# Patient Record
Sex: Female | Born: 1965 | Race: Asian | Hispanic: No | Marital: Married | Smoking: Never smoker
Health system: Southern US, Community
[De-identification: ages and names within clinical notes are randomized; demographics above are authoritative.]

## PROBLEM LIST (undated history)

## (undated) ENCOUNTER — Inpatient Hospital Stay: Admission: EM | Payer: Self-pay | Source: Home / Self Care

## (undated) DIAGNOSIS — N939 Abnormal uterine and vaginal bleeding, unspecified: Secondary | ICD-10-CM

## (undated) DIAGNOSIS — Z973 Presence of spectacles and contact lenses: Secondary | ICD-10-CM

## (undated) DIAGNOSIS — I10 Essential (primary) hypertension: Secondary | ICD-10-CM

## (undated) DIAGNOSIS — F419 Anxiety disorder, unspecified: Secondary | ICD-10-CM

## (undated) HISTORY — DX: Essential (primary) hypertension: I10

## (undated) HISTORY — DX: Abnormal uterine and vaginal bleeding, unspecified: N93.9

## (undated) HISTORY — PX: BREAST ENHANCEMENT SURGERY: SHX7

## (undated) HISTORY — PX: NASAL SINUS SURGERY: SHX719

## (undated) HISTORY — DX: Anxiety disorder, unspecified: F41.9

---

## 2011-11-15 ENCOUNTER — Ambulatory Visit (INDEPENDENT_AMBULATORY_CARE_PROVIDER_SITE_OTHER): Payer: BC Managed Care – PPO | Admitting: Internal Medicine

## 2011-11-15 VITALS — BP 128/84 | HR 77 | Temp 97.7°F | Ht 62.0 in | Wt 104.0 lb

## 2011-11-15 DIAGNOSIS — E010 Iodine-deficiency related diffuse (endemic) goiter: Secondary | ICD-10-CM | POA: Insufficient documentation

## 2011-11-15 DIAGNOSIS — R209 Unspecified disturbances of skin sensation: Secondary | ICD-10-CM

## 2011-11-15 DIAGNOSIS — M779 Enthesopathy, unspecified: Secondary | ICD-10-CM

## 2011-11-15 DIAGNOSIS — R202 Paresthesia of skin: Secondary | ICD-10-CM

## 2011-11-15 DIAGNOSIS — E049 Nontoxic goiter, unspecified: Secondary | ICD-10-CM

## 2011-11-15 LAB — CBC WITH DIFFERENTIAL/PLATELET
Basophils Absolute: 0 10*3/uL (ref 0.0–0.1)
Eosinophils Relative: 0.6 % (ref 0.0–5.0)
Monocytes Absolute: 0.4 10*3/uL (ref 0.1–1.0)
Monocytes Relative: 5.7 % (ref 3.0–12.0)
Neutrophils Relative %: 63.2 % (ref 43.0–77.0)
Platelets: 207 10*3/uL (ref 150.0–400.0)
WBC: 7.4 10*3/uL (ref 4.5–10.5)

## 2011-11-15 LAB — BASIC METABOLIC PANEL
BUN: 9 mg/dL (ref 6–23)
Creatinine, Ser: 0.6 mg/dL (ref 0.4–1.2)
GFR: 109.12 mL/min (ref >60.00)

## 2011-11-15 LAB — TSH: TSH: 1.54 u[IU]/mL (ref 0.35–5.50)

## 2011-11-15 LAB — VITAMIN B12: Vitamin B-12: 953 pg/mL — ABNORMAL HIGH (ref 211–911)

## 2011-11-15 MED ORDER — PREDNISONE 10 MG PO TABS: ORAL_TABLET | ORAL | Status: DC

## 2011-11-15 NOTE — Patient Instructions (Signed)
Will schedule a ultrasound of your thyroid Came back in 3 weeks

## 2011-11-15 NOTE — Assessment & Plan Note (Signed)
Tip of the finger paresthesia, pinprick examination normal. Labs.

## 2011-11-15 NOTE — Assessment & Plan Note (Addendum)
Incidental finding, we'll check labs and ultrasound Son--->  Hieu [phone]

## 2011-11-15 NOTE — Progress Notes (Signed)
  Subjective:    Patient ID: Latoya Lawson, female    DOB: 01-Mar-1966, 46 y.o.   MRN: [id number]  HPI New patient, here with a translator. Chief complaint today is pain at the right middle finger for few weeks. The pain is associated with swelling on most noticeable at the base of the finger and the PIP. Denies any injury although she works at a [employer] and she uses her fingers a lot. Also some numbness in all fingers.  Past medical history No active med problems   Past surgical history T&A  Social history From [place] Married, children x 2  Tobacco--no ETOH-- rarely   Family history Arthritis, mother High cholesterol, mother and father Hypertension father mother Diabetes--father Colon cancer--no Breast cancer--no   Review of Systems No fever chills, no headaches or weight loss. No skin rashes. No lower extremity edema. Denies actual pain at the shoulder, elbow, and the wrists. other fingers are not involved either. No neck pain    Objective:   Physical Exam  Constitutional: She is oriented to person, place, and time. She appears well-developed and well-nourished.  HENT:  Head: Normocephalic and atraumatic.  Neck:       Symmetrically enlarged thyroid gland, smooth, no tender   Cardiovascular: Normal rate, regular rhythm and normal heart sounds.   No murmur heard. Pulmonary/Chest: Effort normal and breath sounds normal. No respiratory distress. She has no wheezes. She has no rales.  Musculoskeletal:       Left hand and wrist normal. Right hand and wrist normal except for a very subtle swelling at the  middle finger PIP along with some tenderness. She is also tender at the base of the middle finger, palmar aspect. A trigger phenomenon noted in the same finger. No plaques to suggest Dupuytren's.  Neurological: She is alert and oriented to person, place, and time.       DTRs normal Pinprick examination of UE normal       Assessment & Plan:  Today , I spent more than 30  min with the patient, >50% of the time counseling in detail about the plan of care through a translator

## 2011-11-15 NOTE — Assessment & Plan Note (Signed)
Symptoms consistent with finger tendinitis. We'll treat with a short course of steroids and reassess in 3 weeks. Will also do x-ray of the hand

## 2011-11-16 ENCOUNTER — Encounter: Payer: Self-pay | Admitting: Internal Medicine

## 2011-11-20 ENCOUNTER — Encounter: Payer: Self-pay | Admitting: *Deleted

## 2011-11-21 NOTE — Progress Notes (Signed)
thanks

## 2011-11-22 ENCOUNTER — Ambulatory Visit
Admission: RE | Admit: 2011-11-22 | Discharge: 2011-11-22 | Disposition: A | Discharge status: Home / Self Care | Payer: BC Managed Care – PPO | Source: Ambulatory Visit | Attending: Internal Medicine | Admitting: Internal Medicine

## 2011-11-22 DIAGNOSIS — E010 Iodine-deficiency related diffuse (endemic) goiter: Secondary | ICD-10-CM

## 2011-11-28 ENCOUNTER — Telehealth: Payer: Self-pay | Admitting: *Deleted

## 2011-11-28 NOTE — Telephone Encounter (Signed)
Please advise 

## 2011-11-28 NOTE — Telephone Encounter (Signed)
I personally discuss results w/ pt's son , thyroid is enlarged and has a nodule, need to repeat in 6 months. Please discuss that w/  Pt's son, OV if further advise needed

## 2011-11-28 NOTE — Telephone Encounter (Signed)
LMOVM to return call.

## 2011-11-28 NOTE — Telephone Encounter (Signed)
[facility] Triage Call Report Triage Record Num: [id number] Operator: Tesha Archambeau Patient Name: [facility] Go Call Date & Time: 11/28/2011 9:46:22AM Patient Phone: [phone] PCP: Nolon Rod. Paz Patient Gender: Female PCP Fax : Patient DOB: [birth year] Practice Name: [facility] Reason for Call: Caller: Hieu/Other; PCP: Willow Ora; CB#: [phone]; Son calling, she got a call today that her thyroid gland was enlarged and she needs some testing. He is trying to find out additional information. PLEASE CALL and ask to speak with Lottie Mussel so he can help her understand . Protocol(s) Used: Office Note Recommended Outcome per Protocol: Information Noted and Sent to Office Reason for Outcome: Caller information to office Care Advice: ~ 02/

## 2011-12-04 ENCOUNTER — Ambulatory Visit (INDEPENDENT_AMBULATORY_CARE_PROVIDER_SITE_OTHER): Payer: BC Managed Care – PPO | Admitting: Internal Medicine

## 2011-12-04 VITALS — BP 136/84 | HR 100 | Temp 98.1°F | Wt 103.0 lb

## 2011-12-04 DIAGNOSIS — E010 Iodine-deficiency related diffuse (endemic) goiter: Secondary | ICD-10-CM

## 2011-12-04 DIAGNOSIS — E049 Nontoxic goiter, unspecified: Secondary | ICD-10-CM

## 2011-12-04 DIAGNOSIS — M779 Enthesopathy, unspecified: Secondary | ICD-10-CM

## 2011-12-04 MED ORDER — AZELASTINE HCL 0.1 % NA SOLN: 2.0000 | Freq: Every day | NASAL | Status: DC

## 2011-12-04 NOTE — Patient Instructions (Signed)
Will refer you to a specialist for the hand pain, ask your son to call the office next week if you haven't heard from [place] --------------------------------------------------------------------- We will call you in 6 months to schedule your thyroid ultrasound --------------------------------------------------------------------- Fore the nose congestion, use astelin a nasal spray, 2 puffs on each side of the nose every night until better Call if you get worse

## 2011-12-04 NOTE — Assessment & Plan Note (Addendum)
Ultrasound show a thyroid nodule, I explained to the best of my ability that this is likely a benign finding but we need to monitor this situation with the ultrasound in 6 months. If the thyroid nodule change or increase in size, she may need a biopsy;  not we'll continue monitoring. She likes to get it "treated", I explained her that the one way to treat this is by doing surgery but at this time I don't think is necessary.

## 2011-12-04 NOTE — Progress Notes (Signed)
  Subjective:    Patient ID: Latoya Lawson, female    DOB: [birth year], 46 y.o.   MRN: [id number]  HPI Here with a translator for a followup from the last office visit. Her main concern is to understand the results of the thyroid ultrasound. She took prednisone for tendinitis in the right hand, has improved 60% but is still bothered by her symptoms. Additionally, she developed nose congestion and postnasal dripping 3 days ago.  Past medical history  2-12 thyroid u/s---> nodule, repeat 05-2011 Past surgical history  T&A  Social history  From [place]  Married, children x 2  Tobacco--no  ETOH-- rarely    Review of Systems No fever or chills No cough or chest congestion    Objective:   Physical Exam Alert, oriented x3, no apparent distress. HEENT: Tympanic membranes normal, throat normal, no congested, face symmetric and nontender to palpation Lungs: Clear to auscultation bilaterally MS: Left hand and wrist normal. Right hand and wrist normal no swelling , + trigger phenomenon noted in the middle finger. No plaques to suggest Dupuytren's.        Assessment & Plan:  Today , I spent more than 15  min with the patient, >50% of the time counseling about the thyroid nodule

## 2011-12-04 NOTE — Assessment & Plan Note (Addendum)
Has improved "60%" she continued with some pain and she still has a trigger  phenomenon. Actually the hand x-ray was not done. Plan: ortho referral, may benefit from a local injection rather than continue taking anti-inflammatories

## 2011-12-05 ENCOUNTER — Encounter: Payer: Self-pay | Admitting: Internal Medicine

## 2011-12-12 ENCOUNTER — Ambulatory Visit: Payer: BC Managed Care – PPO | Admitting: Internal Medicine

## 2012-05-29 ENCOUNTER — Telehealth: Payer: Self-pay | Admitting: Internal Medicine

## 2012-05-29 DIAGNOSIS — E010 Iodine-deficiency related diffuse (endemic) goiter: Secondary | ICD-10-CM

## 2012-05-29 NOTE — Telephone Encounter (Signed)
please arrange a thyroid ultrasound--- dx thyromegaly

## 2012-05-31 NOTE — Telephone Encounter (Signed)
Order entered

## 2012-06-10 ENCOUNTER — Ambulatory Visit
Admission: RE | Admit: 2012-06-10 | Discharge: 2012-06-10 | Disposition: A | Discharge status: Home / Self Care | Payer: BC Managed Care – PPO | Source: Ambulatory Visit | Attending: Internal Medicine | Admitting: Internal Medicine

## 2012-06-10 DIAGNOSIS — E010 Iodine-deficiency related diffuse (endemic) goiter: Secondary | ICD-10-CM

## 2012-06-14 ENCOUNTER — Encounter: Payer: Self-pay | Admitting: *Deleted

## 2013-05-07 ENCOUNTER — Ambulatory Visit: Payer: BC Managed Care – PPO | Admitting: Internal Medicine

## 2013-06-16 ENCOUNTER — Telehealth: Payer: Self-pay | Admitting: Internal Medicine

## 2013-06-16 ENCOUNTER — Encounter: Payer: Self-pay | Admitting: *Deleted

## 2013-06-16 NOTE — Telephone Encounter (Signed)
Call pt, she is due for a CPX, will also  arrange for a thyroid u/s at time of visit. Will need a interpreter. If unable to communicate via phone, send a letter

## 2013-06-16 NOTE — Telephone Encounter (Signed)
Letter sent to patient to call and schedule appt for CPE. Unable to communicate with pt via phone, interpreter not available.

## 2013-10-10 ENCOUNTER — Encounter: Payer: Self-pay | Admitting: Family Medicine

## 2013-10-10 ENCOUNTER — Ambulatory Visit (INDEPENDENT_AMBULATORY_CARE_PROVIDER_SITE_OTHER): Payer: BC Managed Care – PPO | Admitting: Family Medicine

## 2013-10-10 VITALS — BP 152/88 | HR 69 | Temp 97.9°F | Wt 98.4 lb

## 2013-10-10 DIAGNOSIS — IMO0001 Reserved for inherently not codable concepts without codable children: Secondary | ICD-10-CM

## 2013-10-10 DIAGNOSIS — J329 Chronic sinusitis, unspecified: Secondary | ICD-10-CM

## 2013-10-10 DIAGNOSIS — R030 Elevated blood-pressure reading, without diagnosis of hypertension: Secondary | ICD-10-CM

## 2013-10-10 DIAGNOSIS — I10 Essential (primary) hypertension: Secondary | ICD-10-CM

## 2013-10-10 DIAGNOSIS — R51 Headache: Secondary | ICD-10-CM

## 2013-10-10 LAB — HEPATIC FUNCTION PANEL
ALBUMIN: 4.5 g/dL (ref 3.5–5.2)
ALK PHOS: 43 U/L (ref 39–117)
ALT: 16 U/L (ref 0–35)
AST: 16 U/L (ref 0–37)
BILIRUBIN DIRECT: 0 mg/dL (ref 0.0–0.3)
TOTAL PROTEIN: 7.6 g/dL (ref 6.0–8.3)
Total Bilirubin: 0.8 mg/dL (ref 0.3–1.2)

## 2013-10-10 LAB — CBC WITH DIFFERENTIAL/PLATELET
BASOS PCT: 0.3 % (ref 0.0–3.0)
Basophils Absolute: 0 10*3/uL (ref 0.0–0.1)
EOS ABS: 0 10*3/uL (ref 0.0–0.7)
EOS PCT: 0.5 % (ref 0.0–5.0)
HCT: 41.5 % (ref 36.0–46.0)
HEMOGLOBIN: 14.1 g/dL (ref 12.0–15.0)
Lymphocytes Relative: 26.7 % (ref 12.0–46.0)
Lymphs Abs: 1.9 10*3/uL (ref 0.7–4.0)
MCHC: 33.9 g/dL (ref 30.0–36.0)
MCV: 89.7 fl (ref 78.0–100.0)
MONO ABS: 0.5 10*3/uL (ref 0.1–1.0)
Monocytes Relative: 6.3 % (ref 3.0–12.0)
NEUTROS ABS: 4.8 10*3/uL (ref 1.4–7.7)
Neutrophils Relative %: 66.2 % (ref 43.0–77.0)
Platelets: 210 10*3/uL (ref 150.0–400.0)
RBC: 4.62 Mil/uL (ref 3.87–5.11)
RDW: 12.2 % (ref 11.5–14.6)
WBC: 7.3 10*3/uL (ref 4.5–10.5)

## 2013-10-10 LAB — LIPID PANEL
CHOL/HDL RATIO: 3
Cholesterol: 179 mg/dL (ref 0–200)
HDL: 64 mg/dL (ref >39.00)
LDL Cholesterol: 93 mg/dL (ref 0–99)
TRIGLYCERIDES: 111 mg/dL (ref 0.0–149.0)
VLDL: 22.2 mg/dL (ref 0.0–40.0)

## 2013-10-10 LAB — BASIC METABOLIC PANEL
BUN: 9 mg/dL (ref 6–23)
CHLORIDE: 104 meq/L (ref 96–112)
CO2: 29 meq/L (ref 19–32)
Calcium: 9.2 mg/dL (ref 8.4–10.5)
Creatinine, Ser: 0.6 mg/dL (ref 0.4–1.2)
GFR: 110.2 mL/min (ref >60.00)
Glucose, Bld: 93 mg/dL (ref 70–99)
POTASSIUM: 3.5 meq/L (ref 3.5–5.1)
SODIUM: 138 meq/L (ref 135–145)

## 2013-10-10 LAB — TSH: TSH: 0.44 u[IU]/mL (ref 0.35–5.50)

## 2013-10-10 MED ORDER — LISINOPRIL 10 MG PO TABS: 10.0000 mg | ORAL_TABLET | Freq: Every day | ORAL | Status: DC

## 2013-10-10 NOTE — Progress Notes (Signed)
Pre visit review using our clinic review tool, if applicable. No additional management support is needed unless otherwise documented below in the visit note. 

## 2013-10-10 NOTE — Progress Notes (Signed)
  Subjective:    Latoya Lawson is a 48 y.o. female who presents for evaluation of elevated blood pressures. Translator via phone.   Age at onset of elevated blood pressure:  47. Cardiac symptoms: none. Patient denies: chest pain, chest pressure/discomfort, claudication, dyspnea, exertional chest pressure/discomfort, fatigue, irregular heart beat, lower extremity edema, near-syncope, orthopnea, palpitations, paroxysmal nocturnal dyspnea, syncope and tachypnea. Cardiovascular risk factors: hypertension. Use of agents associated with hypertension: none. History of target organ damage: none.  Pt states bp has been running high last few months.  She is also requesting labs be done. Pt c/o headaches recently.  No congestion---pt with hx of sinus surgery and thinks some of her symptoms are because of her sinuses.  The following portions of the patient's history were reviewed and updated as appropriate: allergies, current medications, past family history, past medical history, past social history, past surgical history and problem list.  Review of Systems Pertinent items are noted in HPI.   Objective:    BP 152/88  Pulse 69  Temp(Src) 97.9 F (36.6 C) (Oral)  Wt 98 lb 6.4 oz (44.634 kg)  SpO2 98% General appearance: alert, cooperative, appears stated age and no distress Eyes: conjunctivae/corneas clear. PERRL, EOM's intact. Fundi benign. Ears: normal TM's and external ear canals both ears Nose: no discharge, no congestion, turbinates red, swollen Throat: lips, mucosa, and tongue normal; teeth and gums normal Neck: no adenopathy, supple, symmetrical, trachea midline and thyroid not enlarged, symmetric, no tenderness/mass/nodules Lungs: clear to auscultation bilaterally Heart: S1, S2 normal Extremities: extremities normal, atraumatic, no cyanosis or edema  Cardiographics ECG: normal sinus rhythm    Assessment:    Hypertension, stage 1 . Evidence of target organ damage: none.    Plan:    Medication: begin lisinopril. Screening labs for initial evaluation: basic metabolic panel, lipid panel and urinalysis. Dietary sodium restriction. Regular aerobic exercise. Check blood pressures 2-3 times weekly and record. Follow up: 2 weeks and as needed. --with pcp

## 2013-10-10 NOTE — Assessment & Plan Note (Signed)
Ct sinuses May need ent f/u

## 2013-10-10 NOTE — Patient Instructions (Signed)

## 2013-10-13 LAB — POCT URINALYSIS DIPSTICK
BILIRUBIN UA: NEGATIVE
Blood, UA: NEGATIVE
GLUCOSE UA: NEGATIVE
KETONES UA: NEGATIVE
LEUKOCYTES UA: NEGATIVE
NITRITE UA: NEGATIVE
PH UA: 6.5
Protein, UA: NEGATIVE
Spec Grav, UA: 1.01
Urobilinogen, UA: 0.2

## 2013-10-27 ENCOUNTER — Ambulatory Visit: Payer: BC Managed Care – PPO | Admitting: Internal Medicine

## 2013-10-27 DIAGNOSIS — Z0289 Encounter for other administrative examinations: Secondary | ICD-10-CM

## 2013-11-10 ENCOUNTER — Emergency Department (HOSPITAL_BASED_OUTPATIENT_CLINIC_OR_DEPARTMENT_OTHER)
Admission: EM | Admit: 2013-11-10 | Discharge: 2013-11-10 | Disposition: A | Discharge status: Other Acute Inpatient Hospital | Payer: BC Managed Care – PPO | Attending: Emergency Medicine | Admitting: Emergency Medicine

## 2013-11-10 ENCOUNTER — Ambulatory Visit (HOSPITAL_BASED_OUTPATIENT_CLINIC_OR_DEPARTMENT_OTHER)
Admission: RE | Admit: 2013-11-10 | Discharge: 2013-11-10 | Disposition: A | Discharge status: Home / Self Care | Payer: BC Managed Care – PPO | Source: Ambulatory Visit | Attending: Family Medicine | Admitting: Family Medicine

## 2013-11-10 DIAGNOSIS — R51 Headache: Secondary | ICD-10-CM

## 2013-11-10 DIAGNOSIS — J329 Chronic sinusitis, unspecified: Secondary | ICD-10-CM

## 2013-11-10 DIAGNOSIS — J3489 Other specified disorders of nose and nasal sinuses: Secondary | ICD-10-CM | POA: Insufficient documentation

## 2013-12-04 ENCOUNTER — Encounter: Payer: Self-pay | Admitting: Certified Nurse Midwife

## 2013-12-08 ENCOUNTER — Ambulatory Visit: Payer: Self-pay | Admitting: Certified Nurse Midwife

## 2013-12-15 ENCOUNTER — Encounter: Payer: Self-pay | Admitting: Certified Nurse Midwife

## 2013-12-15 ENCOUNTER — Other Ambulatory Visit: Payer: Self-pay | Admitting: Certified Nurse Midwife

## 2013-12-15 ENCOUNTER — Ambulatory Visit (INDEPENDENT_AMBULATORY_CARE_PROVIDER_SITE_OTHER): Payer: BC Managed Care – PPO | Admitting: Certified Nurse Midwife

## 2013-12-15 VITALS — BP 113/77 | HR 77 | Resp 16 | Ht 61.5 in | Wt 94.0 lb

## 2013-12-15 DIAGNOSIS — Z0000 Encounter for general adult medical examination without abnormal findings: Secondary | ICD-10-CM

## 2013-12-15 DIAGNOSIS — N63 Unspecified lump in unspecified breast: Secondary | ICD-10-CM

## 2013-12-15 DIAGNOSIS — Z01419 Encounter for gynecological examination (general) (routine) without abnormal findings: Secondary | ICD-10-CM

## 2013-12-15 DIAGNOSIS — E049 Nontoxic goiter, unspecified: Secondary | ICD-10-CM

## 2013-12-15 DIAGNOSIS — N6320 Unspecified lump in the left breast, unspecified quadrant: Secondary | ICD-10-CM

## 2013-12-15 LAB — CBC WITH DIFFERENTIAL/PLATELET
BASOS ABS: 0 10*3/uL (ref 0.0–0.1)
Basophils Relative: 0 % (ref 0–1)
EOS ABS: 0.1 10*3/uL (ref 0.0–0.7)
Eosinophils Relative: 1 % (ref 0–5)
HCT: 40.1 % (ref 36.0–46.0)
Hemoglobin: 13.9 g/dL (ref 12.0–15.0)
Lymphocytes Relative: 34 % (ref 12–46)
Lymphs Abs: 2.6 10*3/uL (ref 0.7–4.0)
MCH: 29.5 pg (ref 26.0–34.0)
MCHC: 34.7 g/dL (ref 30.0–36.0)
MCV: 85.1 fL (ref 78.0–100.0)
Monocytes Absolute: 0.8 10*3/uL (ref 0.1–1.0)
Monocytes Relative: 10 % (ref 3–12)
NEUTROS ABS: 4.2 10*3/uL (ref 1.7–7.7)
Neutrophils Relative %: 55 % (ref 43–77)
Platelets: 225 10*3/uL (ref 150–400)
RBC: 4.71 MIL/uL (ref 3.87–5.11)
RDW: 12.4 % (ref 11.5–15.5)
WBC: 7.7 10*3/uL (ref 4.0–10.5)

## 2013-12-15 LAB — THYROID PANEL WITH TSH
FREE THYROXINE INDEX: 4.3 — AB (ref 1.0–3.9)
T3 Uptake: 42.2 % — ABNORMAL HIGH (ref 22.5–37.0)
T4 TOTAL: 10.3 ug/dL (ref 5.0–12.5)
TSH: 0.008 u[IU]/mL — AB (ref 0.350–4.500)

## 2013-12-15 NOTE — Progress Notes (Signed)
Patient scheduled for bilateral dx mammogram with L Breast US for 12/16/13 at 1:15 at [facility]. Patient aware of appointment via translator and given written copy of appointment location.

## 2013-12-15 NOTE — Patient Instructions (Addendum)

## 2013-12-15 NOTE — Progress Notes (Signed)
48 y.o. G2P2 Married Asian Fe here for annual exam. Patient has limited [place] and has interpreter with her. Periods normal, no issues. Contraception condoms consistently. Patient has lost 2 more pounds, current weight 94. Patient feels sad because son is not here in [place]. "She grieves for him". She does talk with him on the phone often which helps. Diet consists of 3 small bowls of rice and vegetables and meat daily. No soda use only coffee and water. Patient works hard daily, but "feels OK, sleeps well". Denies coughing, fevers, or chills. Takes multivitamin daily. Sees PCP for lab work and no abnormal findings last visit. No pain or other problems today. Does not want pills for sadness.  Patient's last menstrual period was 11/28/2013.          Sexually active: yes  The current method of family planning is condoms all the time.    Exercising: no  exercise Smoker:  no  Health Maintenance: Pap:  10-12-11 neg/HPV HR neg MMG: none Colonoscopy:  none BMD:   none TDaP:  2010 Labs: pcp Self breast exam: pt checks   reports that she has never smoked. She has never used smokeless tobacco. She reports that she does not drink alcohol or use illicit drugs.  Past Medical History  Diagnosis Date  . Hypertension   . Anxiety   . Abnormal uterine bleeding     after depo    Past Surgical History  Procedure Laterality Date  . Breast enhancement surgery  2088  . Nasal sinus surgery      Current Outpatient Prescriptions  Medication Sig Dispense Refill  . lisinopril (PRINIVIL,ZESTRIL) 10 MG tablet Take 1 tablet (10 mg total) by mouth daily.  30 tablet  2  . Multiple Vitamin (MULTIVITAMIN) tablet Take 1 tablet by mouth daily.      . Omega-3 Fatty Acids (FISH OIL) 1000 MG CAPS Take 1 capsule by mouth.      Marland Kitchen UNKNOWN TO PATIENT Pill to take for infection      . azelastine (ASTELIN) 137 MCG/SPRAY nasal spray Place 2 sprays into the nose daily. Use in each nostril as directed  30 mL  3   No current  facility-administered medications for this visit.    Family History  Problem Relation Age of Onset  . Hypertension Mother   . Hypertension Father   . Diabetes Father   . Stroke Father   . Hypertension Sister   . Hypertension Brother   . Hypertension Maternal Grandmother   . Hypertension Maternal Grandfather   . Hypertension Paternal Grandmother   . Hypertension Paternal Grandfather     ROS:  Pertinent items are noted in HPI.  Otherwise, a comprehensive ROS was negative.  Exam:   BP 113/77  Pulse 77  Resp 16  Ht 5' 1.5" (1.562 m)  Wt 94 lb (42.638 kg)  BMI 17.48 kg/m2  LMP 11/28/2013 Height: 5' 1.5" (156.2 cm)  Ht Readings from Last 3 Encounters:  12/15/13 5' 1.5" (1.562 m)  11/15/11 5\' 2"  (1.575 m)    General appearance: alert, cooperative and appears stated age Head: Normocephalic, without obvious abnormality, atraumatic Neck: no adenopathy, supple, symmetrical, trachea midline and thyroid, mildly enlarged , no nodules palpated. Lungs: clear to auscultation bilaterally Breasts: normal appearance, no masses or tenderness, No nipple retraction or dimpling, No nipple discharge or bleeding, No axillary or supraclavicular adenopathy, right breast with saline implants. Left breast at 2 o'clock at nipple area in areola 1 cm mass noted, mobile, non  tender, no nipple discharge or skin changes noted, implant appears intact. Heart: regular rate and rhythm Abdomen: soft, non-tender; no masses,  no organomegaly Extremities: extremities normal, atraumatic, no cyanosis or edema Skin: Skin color, texture, turgor normal. No rashes or lesions Lymph nodes: Cervical, supraclavicular, and axillary nodes normal. No abnormal inguinal nodes palpated Neurologic: Grossly normal   Pelvic: External genitalia:  no lesions              Urethra:  normal appearing urethra with no masses, tenderness or lesions              Bartholin's and Skene's: normal                 Vagina: normal appearing  vagina with normal color and discharge, no lesions              Cervix: normal, non tender              Pap taken: yes Bimanual Exam:  Uterus:  normal size, contour, position, consistency, mobility, non-tender and anteverted              Adnexa: normal adnexa and no mass, fullness, tenderness               Rectovaginal: Confirms               Anus:  normal sphincter tone, no lesions  A:  Well Woman with normal exam  Contraception condoms  Weight loss  Left breast mass  ? Depression  Mildly enlarged thyroid, ? Due to body habitus  P:   Reviewed health and wellness pertinent to exam  Discussed weight loss effects on health and well being. Encouraged to start on Ensure protein two daily to help with decreasing muscle loss. Patient has used before and it helped. Encouraged snacks between meals if possible to help control weight loss. Patient will try.  Labs: TSH,CBC with Diff.  Discussed findings and need for evaluation. Patient agreeable. Diagnostic mammogram order with [place] if needed. This appointment was confirmed with patient/intrpreter.  Discussed does she feel depressed?, she does not. No thought of self harm or others. Encouraged to discussed feelings with spouse and plan visit with son.  Pap smear as per guidelines   Mammogram yearly pap smear taken today with reflex  counseled on breast self exam, adequate intake of calcium and vitamin D, diet and exercise  return annually or prn  An After Visit Summary was printed and given to the patient.

## 2013-12-16 ENCOUNTER — Ambulatory Visit: Payer: Self-pay | Admitting: Certified Nurse Midwife

## 2013-12-16 LAB — VITAMIN D 25 HYDROXY (VIT D DEFICIENCY, FRACTURES): VIT D 25 HYDROXY: 30 ng/mL (ref 30–89)

## 2013-12-16 LAB — IPS PAP TEST WITH HPV

## 2013-12-18 LAB — THYROID PEROXIDASE ANTIBODY

## 2013-12-19 NOTE — Progress Notes (Signed)
Consider nutritionist referral.    Reviewed personally.  Felipa Emory, MD.

## 2013-12-24 ENCOUNTER — Other Ambulatory Visit: Payer: Self-pay | Admitting: Certified Nurse Midwife

## 2013-12-24 ENCOUNTER — Telehealth: Payer: Self-pay | Admitting: Certified Nurse Midwife

## 2013-12-24 DIAGNOSIS — E049 Nontoxic goiter, unspecified: Secondary | ICD-10-CM

## 2013-12-24 DIAGNOSIS — R6889 Other general symptoms and signs: Secondary | ICD-10-CM

## 2013-12-24 NOTE — Telephone Encounter (Signed)
Left message with female that answered the phone for the patient to contact our office.   *need to advise of appt w/Dr Carlis Abbott 04.01.2015 @ 1215

## 2013-12-25 ENCOUNTER — Telehealth: Payer: Self-pay | Admitting: Emergency Medicine

## 2013-12-25 NOTE — Telephone Encounter (Signed)
Spoke with Son of patient who confirmed identity. Okay to release information per release of information.   Message from Regina Eck CNM given.  He requested that labs be released on Mychart and they have been released.   Patient has appointment scheduled with Dr. Carlis Abbott for 12/31/13. He is aware of appointment.   Patient also has consult scheduled with [facility] as well for evaluation of breast.

## 2013-12-25 NOTE — Telephone Encounter (Signed)
Message copied by Michele Mcalpine on Thu Dec 25, 2013  4:28 PM ------      Message from: Regina Eck      Created: Wed Dec 24, 2013 11:45 AM       Thyroid antibodies normal, please notify ------

## 2013-12-25 NOTE — Telephone Encounter (Signed)
Notes Recorded by Regina Eck, CNM on 12/24/2013 at 11:45 AM Notify patient that Vitamin D is Borderline low and needs to start on Vitamin D 3 600 IU daily CBC is normal Thyroid profile is abnormal and patient has history of thyroid enlargement with no evaluation since 2013 she needs referral to endocrine for evaluation. Please make sure an appointment is scheduled. She does not understand English without interpreter Order in for internal referral Pap smear reviewed negative HPVHR negative 02

## 2013-12-25 NOTE — Telephone Encounter (Signed)
There is a release of information on file dated 03.16.2015 allowing [place] to speak with patients son Huie Vo. I spoke with her son. Advised of appointment with Dr Carlis Abbott 04.01.2015 @ 1215.

## 2013-12-29 ENCOUNTER — Ambulatory Visit (INDEPENDENT_AMBULATORY_CARE_PROVIDER_SITE_OTHER): Payer: Self-pay | Admitting: Surgery

## 2014-01-05 ENCOUNTER — Telehealth: Payer: Self-pay | Admitting: Certified Nurse Midwife

## 2014-01-05 DIAGNOSIS — E010 Iodine-deficiency related diffuse (endemic) goiter: Secondary | ICD-10-CM

## 2014-01-05 NOTE — Telephone Encounter (Signed)
Patient's son is calling concerning her referral to Dr. Jerline Pain. Patient son "Hiev" says there no interpreter is available with Dr. Jerline Pain. What does she need to do?

## 2014-01-05 NOTE — Telephone Encounter (Signed)
I am not sure what referral and which Dr. Jerline Pain is? Calling to clarify.   Message left to return call to [place] at [phone].

## 2014-01-06 NOTE — Telephone Encounter (Signed)
Spoke with patient's son. He states that patient does not have anyone to come to appointment with her. Advised we are trying to reach interpretor services to set up or get in contact with someone to get interpretor for patient when going to [place]. Advised we would get a way to ensure that patient has interpretor at next appointment and give son a call back. Son is agreeable and verbalizes understanding.

## 2014-01-06 NOTE — Telephone Encounter (Signed)
Called to Dr. Anell Barr office, office manager not in. Asked if I could call back in 1 hour to speak with Latoya Lawson.

## 2014-01-06 NOTE — Telephone Encounter (Signed)
Patient's son called back. He states patient went to see Dr. Jeanann Lewandowsky and there was no interpretor available and patient was alone and so they did not see patient that day.   Calling to Dr. Carlis Abbott office to clarify/reschedule patient. Office opens at 0900: [phone]

## 2014-01-06 NOTE — Telephone Encounter (Signed)
LMTCB for [phone] [facility] to assist.

## 2014-01-06 NOTE — Telephone Encounter (Signed)
Spoke with Dr.Clark's office. Was advised that upon making appointment for patient they instructed that she needed to bring someone with her to help interpret as they do not have this as a resource. She states "We were not even sure what language needed to be interpreted" and questioned "How do you handle this when she comes in." Advised politely that patient has interpreter with her when she comes in. Asked how we could best help this patient get a new appointment to be seen by [facility]. Was advised will need to inform patient's son an appointment can be made at any time that is convenient for someone who speaks English to come with the patient. Advised would call son to see what time works well for their schedule and call back to get patient another appointment. Dr. Carlis Abbott is not in on Friday's so no Friday appointments.   Called patient left message to call [place] back at [phone]

## 2014-01-06 NOTE — Telephone Encounter (Signed)
Spoke with Regina Eck CNM, okay to enter in new referral.   Referral to [facility] entered. Requested first available provider.   Spoke with Tona Sensing at [facility] at [phone] and she advised if any issues with obtaining interpretor to call her for additional help.

## 2014-01-06 NOTE — Telephone Encounter (Signed)
Latoya Lawson, can we make referral to a [facility] provider for endocrinology so that we can set up a [facility] interpretor for an appointment?

## 2014-01-06 NOTE — Telephone Encounter (Signed)
Agree with referral

## 2014-01-09 ENCOUNTER — Encounter: Payer: Self-pay | Admitting: *Deleted

## 2014-01-09 NOTE — Telephone Encounter (Signed)
Reviewed referral note, they are unable to reach patient to patient to schedule. [facility] and scheduled a new patient visit for patient. Mychart message sent as patient's son states he reads that as well. Message left to return call to [facility] at [phone].      01/20/14 at [facility] with Dr. Dwyane Dee.

## 2014-01-13 ENCOUNTER — Encounter (INDEPENDENT_AMBULATORY_CARE_PROVIDER_SITE_OTHER): Payer: Self-pay | Admitting: Surgery

## 2014-01-13 ENCOUNTER — Ambulatory Visit (INDEPENDENT_AMBULATORY_CARE_PROVIDER_SITE_OTHER): Payer: BC Managed Care – PPO | Admitting: Surgery

## 2014-01-13 VITALS — BP 119/72 | HR 70 | Temp 98.6°F | Resp 16 | Ht 62.5 in | Wt 95.4 lb

## 2014-01-13 DIAGNOSIS — N6320 Unspecified lump in the left breast, unspecified quadrant: Secondary | ICD-10-CM

## 2014-01-13 DIAGNOSIS — N63 Unspecified lump in unspecified breast: Secondary | ICD-10-CM

## 2014-01-13 NOTE — Telephone Encounter (Signed)
Patient has read my chart message. 

## 2014-01-13 NOTE — Progress Notes (Signed)
Patient ID: Latoya Lawson, female   DOB: 11-Jul-1966, 48 y.o.   MRN: [id number]  Chief Complaint  Patient presents with  . Breast Problem    HPI Latoya Lawson is a 48 y.o. female.   HPI This is a pleasant female referred by Dr. Marcelo Baldy for evaluation of a left breast mass. The patient does not speak English and is accompanied by an interpreter. I have had a difficult time determining whether she could feel a mass or not. She underwent mammogram and ultrasound demonstrating the 5 mm irregular lesion in the left breast. She has had bilateral breast augmentation. She had an adjustment of the left breast per her report. She denies nipple discharge. She has no previous history of breast masses. She is otherwise without complaints. Past Medical History  Diagnosis Date  . Hypertension   . Anxiety   . Abnormal uterine bleeding     after depo    Past Surgical History  Procedure Laterality Date  . Breast enhancement surgery  2088  . Nasal sinus surgery      Family History  Problem Relation Age of Onset  . Hypertension Mother   . Hypertension Father   . Diabetes Father   . Stroke Father   . Hypertension Sister   . Hypertension Brother   . Hypertension Maternal Grandmother   . Hypertension Maternal Grandfather   . Hypertension Paternal Grandmother   . Hypertension Paternal Grandfather     Social History History  Substance Use Topics  . Smoking status: Never Smoker   . Smokeless tobacco: Never Used  . Alcohol Use: No    No Known Allergies  Current Outpatient Prescriptions  Medication Sig Dispense Refill  . lisinopril (PRINIVIL,ZESTRIL) 10 MG tablet Take 1 tablet (10 mg total) by mouth daily.  30 tablet  2  . Multiple Vitamin (MULTIVITAMIN) tablet Take 1 tablet by mouth daily.      . Omega-3 Fatty Acids (FISH OIL) 1000 MG CAPS Take 1 capsule by mouth.      Marland Kitchen UNKNOWN TO PATIENT Pill to take for infection      . azelastine (ASTELIN) 137 MCG/SPRAY nasal spray Place 2 sprays into the nose  daily. Use in each nostril as directed  30 mL  3   No current facility-administered medications for this visit.    Review of Systems Review of Systems  Constitutional: Negative for fever, chills and unexpected weight change.  HENT: Negative for congestion, hearing loss, sore throat, trouble swallowing and voice change.   Eyes: Negative for visual disturbance.  Respiratory: Negative for cough and wheezing.   Cardiovascular: Negative for chest pain, palpitations and leg swelling.  Gastrointestinal: Negative for nausea, vomiting, abdominal pain, diarrhea, constipation, blood in stool, abdominal distention and anal bleeding.  Genitourinary: Negative for hematuria, vaginal bleeding and difficulty urinating.  Musculoskeletal: Negative for arthralgias.  Skin: Negative for rash and wound.  Neurological: Negative for seizures, syncope and headaches.  Hematological: Negative for adenopathy. Does not bruise/bleed easily.  Psychiatric/Behavioral: Negative for confusion.    Blood pressure 119/72, pulse 70, temperature 98.6 F (37 C), temperature source Oral, resp. rate 16, height 5' 2.5" (1.588 m), weight 95 lb 6.4 oz (43.273 kg), last menstrual period 11/28/2013.  Physical Exam Physical Exam  Constitutional: She is oriented to person, place, and time. She appears well-developed and well-nourished. No distress.  HENT:  Head: Normocephalic and atraumatic.  Right Ear: External ear normal.  Left Ear: External ear normal.  Nose: Nose normal.  Mouth/Throat: Oropharynx  is clear and moist. No oropharyngeal exudate.  Eyes: Conjunctivae are normal. Pupils are equal, round, and reactive to light. Right eye exhibits no discharge. Left eye exhibits no discharge. No scleral icterus.  Neck: Normal range of motion. Neck supple. No tracheal deviation present. No thyromegaly present.  Cardiovascular: Normal rate, regular rhythm, normal heart sounds and intact distal pulses.   No murmur heard. Pulmonary/Chest:  Effort normal and breath sounds normal. No respiratory distress. She has no wheezes. She has no rales.  Abdominal: Soft. Bowel sounds are normal. She exhibits no distension. There is no tenderness. There is no rebound.  Musculoskeletal: Normal range of motion. She exhibits no edema and no tenderness.  Lymphadenopathy:    She has no cervical adenopathy.    She has no axillary adenopathy.  Neurological: She is alert and oriented to person, place, and time.  Skin: Skin is warm and dry. No rash noted. She is not diaphoretic. No erythema.  Psychiatric: Her behavior is normal. Judgment normal.  Breasts: I cannot palpate a mass in either breast. Specifically, at the 1:00 position of left breast, I cannot palpate a 5 mm mass  Data Reviewed I have her ultrasound and mammograms demonstrating the 5 mm mass which is irregular at the 1:00 position of the left breast  Assessment    Left breast mass     Plan    I discussed this with the patient through the interpreter. Removal of the mass is recommended for histologic evaluation to rule out malignancy. As I cannot palpate a mass and she is not a candidate for either needle localization or a stereotactic biopsy given her limited breast tissue and the augmentation, I have discussed this with Dr. Marcelo Baldy.  He will see the patient preoperatively on the day of surgery and mark where the mass is on the overlying skin so I can proceed with excision through the mark. I discussed the risks of surgery which include but not limited to bleeding, infection, need for further surgery should malignancy be present, damage to the implant, et Ronney Asters. She understands and wishes to proceed. Surgery will be scheduled        Harl Bowie 01/13/2014, 3:38 PM

## 2014-01-20 ENCOUNTER — Ambulatory Visit (INDEPENDENT_AMBULATORY_CARE_PROVIDER_SITE_OTHER): Payer: BC Managed Care – PPO | Admitting: Endocrinology

## 2014-01-20 ENCOUNTER — Telehealth: Payer: Self-pay | Admitting: Family Medicine

## 2014-01-20 ENCOUNTER — Encounter: Payer: Self-pay | Admitting: Endocrinology

## 2014-01-20 VITALS — BP 122/82 | HR 84 | Temp 97.6°F | Resp 14 | Ht 62.0 in | Wt 98.0 lb

## 2014-01-20 DIAGNOSIS — E0590 Thyrotoxicosis, unspecified without thyrotoxic crisis or storm: Secondary | ICD-10-CM

## 2014-01-20 DIAGNOSIS — I10 Essential (primary) hypertension: Secondary | ICD-10-CM

## 2014-01-20 DIAGNOSIS — E049 Nontoxic goiter, unspecified: Secondary | ICD-10-CM

## 2014-01-20 LAB — T3, FREE: T3 FREE: 2.6 pg/mL (ref 2.3–4.2)

## 2014-01-20 LAB — T4, FREE: FREE T4: 0.71 ng/dL (ref 0.60–1.60)

## 2014-01-20 MED ORDER — LISINOPRIL 10 MG PO TABS: 10.0000 mg | ORAL_TABLET | Freq: Every day | ORAL | Status: DC

## 2014-01-20 NOTE — Telephone Encounter (Signed)
Patient's husband called requesting refill on lisinopril. They use [facility] on [address]

## 2014-01-20 NOTE — Progress Notes (Signed)
Quick Note:  Please let patient know that the thyroid result is back to normal and no further action needed, followup in 6 months ______

## 2014-01-20 NOTE — Patient Instructions (Signed)
Rx after blood tests back

## 2014-01-20 NOTE — Telephone Encounter (Signed)
Rx sent and MSG left advising appointment due       KP

## 2014-01-20 NOTE — Progress Notes (Signed)
Patient ID: Latoya Lawson, female   DOB: 1966-09-21, 48 y.o.   MRN: [id number]                                                                                                              Gardiner Ramus  Reason for Appointment:  Hyperthyroidism, new consultation    History of Present Illness:   The patient has been seen in evaluation for general medical care by her PCP periodically She has been noticed to have a thyroid enlargement since at least 2013 Previous thyroid ultrasounds have showed only subcentimeter thyroid nodules TPO antibody has been negative On her routine visit with her gynecologist because of weight loss of about 4 pounds she was evaluated with thyroid functions. However it was also felt that she was having some depression because of being away from her family She does not think she has had any palpitations, shakiness, feeling excessively warm and sweaty, nervousness or fatigue.  She has had some difficulty sleeping and a little decreased appetite However appears that the weight has come back up  Wt Readings from Last 3 Encounters:  01/20/14 98 lb (44.453 kg)  01/13/14 95 lb 6.4 oz (43.273 kg)  12/15/13 94 lb (42.638 kg)     She was evaluated with thyroid function tests which showed the following:     Lab Results  Component Value Date   TSH 0.008* 12/15/2013   T4TOTAL 10.3 12/15/2013     Lab Results  Component Value Date   TSH 0.008* 12/15/2013   TSH 0.44 10/10/2013   TSH 1.54 11/15/2011       Medication List       This list is accurate as of: 01/20/14  9:18 AM.  Always use your most recent med list.               azelastine 137 MCG/SPRAY nasal spray  Commonly known as:  ASTELIN  Place into both nostrils 2 (two) times daily. Use in each nostril as directed     azelastine 137 MCG/SPRAY nasal spray  Commonly known as:  ASTELIN  Place 2 sprays into the nose daily. Use in each nostril as directed     Fish Oil 1000 MG Caps  Take 1 capsule by mouth.     lisinopril 10 MG tablet  Commonly known as:  PRINIVIL,ZESTRIL  Take 1 tablet (10 mg total) by mouth daily.     multivitamin tablet  Take 1 tablet by mouth daily.     UNKNOWN TO PATIENT  Pill to take for infection            Past Medical History  Diagnosis Date  . Hypertension   . Anxiety   . Abnormal uterine bleeding     after depo    Past Surgical History  Procedure Laterality Date  . Breast enhancement surgery  2088  . Nasal sinus surgery      Family History  Problem Relation Age of Onset  . Hypertension Mother   . Hypertension Father   .  Diabetes Father   . Stroke Father   . Hypertension Sister   . Hypertension Brother   . Hypertension Maternal Grandmother   . Hypertension Maternal Grandfather   . Hypertension Paternal Grandmother   . Hypertension Paternal Grandfather     Social History:  reports that she has never smoked. She has never used smokeless tobacco. She reports that she does not drink alcohol or use illicit drugs.  Allergies: No Known Allergies  Review of Systems:  Has a history of high blood pressure.       No history of dyspnea on exertion.      No complaints of change in bowel habits.      Negative  history of Diabetes.     No changes in menstrual cycles recently   Examination:   BP 122/82  Pulse 84  Temp(Src) 97.6 F (36.4 C)  Resp 14  Ht 5\' 2"  (1.575 m)  Wt 98 lb (44.453 kg)  BMI 17.92 kg/m2  SpO2 97%   General Appearance:  she is thinly built, averagely nourished, pleasant, not anxious or hyperkinetic.        Eyes: No unusual prominence, lid lag or stare. No swelling of the eyelids  Neck: The thyroid is enlarged about twice normal, smooth, slightly firm, non-tender and diffuse.  There is no lymphadenopathy .          Heart:  repeat heart rate 76. normal S1 and S2, no murmurs .         Lungs: breath sounds are clear bilaterally  Extremities: hands are warm. No ankle edema. Neurological: REFLEXES: at biceps are normal .   TREMORS:  no  tremors are present..    Assessment/Plan:   Hyperthyroidism, mild and not clear if this is related to early Graves' disease or silent thyroiditis She had relatively normal TSH in 1/15 and she does not appear to have any typical symptoms of hyperthyroidism She is also appearing euthyroid on exam  Will check her free T4 and T3 today and if persistently high will start her on low-dose methimazole Discussed this with the patient through the interpreter If she is started on methimazole she will be back in one month for followup with repeat labs  Elayne Snare 01/20/2014, 9:18 AM

## 2014-01-20 NOTE — Telephone Encounter (Signed)
Routing to Regina Eck CNM, patient has seen Endocrinology today. Will close encounter.  Routing to provider for final review. Patient agreeable to disposition. Will close encounter

## 2014-01-21 ENCOUNTER — Telehealth: Payer: Self-pay | Admitting: Family Medicine

## 2014-01-21 ENCOUNTER — Encounter: Payer: Self-pay | Admitting: *Deleted

## 2014-01-21 NOTE — Telephone Encounter (Signed)
Relevant patient education assigned to patient using Emmi. ° °

## 2014-01-26 ENCOUNTER — Ambulatory Visit (INDEPENDENT_AMBULATORY_CARE_PROVIDER_SITE_OTHER): Payer: BC Managed Care – PPO | Admitting: Family Medicine

## 2014-01-26 ENCOUNTER — Encounter: Payer: Self-pay | Admitting: Family Medicine

## 2014-01-26 VITALS — BP 134/82 | HR 72 | Temp 98.4°F | Wt 96.8 lb

## 2014-01-26 DIAGNOSIS — J309 Allergic rhinitis, unspecified: Secondary | ICD-10-CM

## 2014-01-26 DIAGNOSIS — J302 Other seasonal allergic rhinitis: Secondary | ICD-10-CM

## 2014-01-26 DIAGNOSIS — I10 Essential (primary) hypertension: Secondary | ICD-10-CM

## 2014-01-26 MED ORDER — LISINOPRIL 20 MG PO TABS: 20.0000 mg | ORAL_TABLET | Freq: Every day | ORAL | Status: DC

## 2014-01-26 MED ORDER — CETIRIZINE HCL 10 MG PO TABS: 10.0000 mg | ORAL_TABLET | Freq: Every day | ORAL | Status: DC

## 2014-01-26 NOTE — Progress Notes (Signed)
Reviewed note under follow up

## 2014-01-26 NOTE — Progress Notes (Signed)
Reviewed note, in mm hold

## 2014-01-26 NOTE — Progress Notes (Signed)
Pre visit review using our clinic review tool, if applicable. No additional management support is needed unless otherwise documented below in the visit note. 

## 2014-01-26 NOTE — Progress Notes (Signed)
   Subjective:    Patient ID: Gardiner Ramus, female    DOB: 09-20-1966, 48 y.o.   MRN: [id number]  HPI Pt here f/u bp.  Pt c/o cough and mucous production.  She went to ent and her sinuses are fine.  No difficulty swallowing.  No chest pain.  No numbness / tingling.      Review of Systems    as above Objective:   Physical Exam BP 134/82  Pulse 72  Temp(Src) 98.4 F (36.9 C) (Oral)  Wt 96 lb 12.8 oz (43.908 kg)  SpO2 99% General appearance: alert, cooperative, appears stated age and no distress Ears: normal TM's and external ear canals both ears Nose: Nares normal. Septum midline. Mucosa normal. No drainage or sinus tenderness., clear discharge, mild congestion, turbinates red, swollen, no sinus tenderness Throat: lips, mucosa, and tongue normal; teeth and gums normal Neck: no adenopathy, supple, symmetrical, trachea midline and thyroid not enlarged, symmetric, no tenderness/mass/nodules Lungs: clear to auscultation bilaterally Heart: regular rate and rhythm, S1, S2 normal, no murmur, click, rub or gallop Extremities: extremities normal, atraumatic, no cyanosis or edema       Assessment & Plan:  1. HTN (hypertension) Inc lisinopril and f/u 2-3 weeks - lisinopril (PRINIVIL,ZESTRIL) 20 MG tablet; Take 1 tablet (20 mg total) by mouth daily.  Dispense: 90 tablet; Refill: 3  2. Seasonal allergies  - cetirizine (ZYRTEC) 10 MG tablet; Take 1 tablet (10 mg total) by mouth daily.  Dispense: 30 tablet; Refill: 11

## 2014-01-26 NOTE — Patient Instructions (Signed)
Allergic Rhinitis Allergic rhinitis is when the mucous membranes in the nose respond to allergens. Allergens are particles in the air that cause your body to have an allergic reaction. This causes you to release allergic antibodies. Through a chain of events, these eventually cause you to release histamine into the blood stream. Although meant to protect the body, it is this release of histamine that causes your discomfort, such as frequent sneezing, congestion, and an itchy, runny nose.  CAUSES  Seasonal allergic rhinitis (hay fever) is caused by pollen allergens that may come from grasses, trees, and weeds. Year-round allergic rhinitis (perennial allergic rhinitis) is caused by allergens such as house dust mites, pet dander, and mold spores.  SYMPTOMS   Nasal stuffiness (congestion).  Itchy, runny nose with sneezing and tearing of the eyes. DIAGNOSIS  Your health care provider can help you determine the allergen or allergens that trigger your symptoms. If you and your health care provider are unable to determine the allergen, skin or blood testing may be used. TREATMENT  Allergic Rhinitis does not have a cure, but it can be controlled by:  Medicines and allergy shots (immunotherapy).  Avoiding the allergen. Hay fever may often be treated with antihistamines in pill or nasal spray forms. Antihistamines block the effects of histamine. There are over-the-counter medicines that may help with nasal congestion and swelling around the eyes. Check with your health care provider before taking or giving this medicine.  If avoiding the allergen or the medicine prescribed do not work, there are many new medicines your health care provider can prescribe. Stronger medicine may be used if initial measures are ineffective. Desensitizing injections can be used if medicine and avoidance does not work. Desensitization is when a patient is given ongoing shots until the body becomes less sensitive to the allergen.  Make sure you follow up with your health care provider if problems continue. HOME CARE INSTRUCTIONS It is not possible to completely avoid allergens, but you can reduce your symptoms by taking steps to limit your exposure to them. It helps to know exactly what you are allergic to so that you can avoid your specific triggers. SEEK MEDICAL CARE IF:   You have a fever.  You develop a cough that does not stop easily (persistent).  You have shortness of breath.  You start wheezing.  Symptoms interfere with normal daily activities. Document Released: 06/13/2001 Document Revised: 07/09/2013 Document Reviewed: 05/26/2013 [employer] Patient Information 2014 [employer].  

## 2014-02-09 ENCOUNTER — Encounter (HOSPITAL_BASED_OUTPATIENT_CLINIC_OR_DEPARTMENT_OTHER): Payer: Self-pay | Admitting: *Deleted

## 2014-02-09 NOTE — H&P (Signed)
Chief Complaint   Patient presents with   .  Breast Problem   HPI  Latoya Lawson is a 48 y.o. female.  HPI  This is a pleasant female referred by Dr. Marcelo Baldy for evaluation of a left breast mass. The patient does not speak English and is accompanied by an interpreter. I have had a difficult time determining whether she could feel a mass or not. She underwent mammogram and ultrasound demonstrating the 5 mm irregular lesion in the left breast. She has had bilateral breast augmentation. She had an adjustment of the left breast per her report. She denies nipple discharge. She has no previous history of breast masses. She is otherwise without complaints.  Past Medical History   Diagnosis  Date   .  Hypertension    .  Anxiety    .  Abnormal uterine bleeding      after depo    Past Surgical History   Procedure  Laterality  Date   .  Breast enhancement surgery   2088   .  Nasal sinus surgery      Family History   Problem  Relation  Age of Onset   .  Hypertension  Mother    .  Hypertension  Father    .  Diabetes  Father    .  Stroke  Father    .  Hypertension  Sister    .  Hypertension  Brother    .  Hypertension  Maternal Grandmother    .  Hypertension  Maternal Grandfather    .  Hypertension  Paternal Grandmother    .  Hypertension  Paternal Grandfather    Social History  History   Substance Use Topics   .  Smoking status:  Never Smoker   .  Smokeless tobacco:  Never Used   .  Alcohol Use:  No   No Known Allergies  Current Outpatient Prescriptions   Medication  Sig  Dispense  Refill   .  lisinopril (PRINIVIL,ZESTRIL) 10 MG tablet  Take 1 tablet (10 mg total) by mouth daily.  30 tablet  2   .  Multiple Vitamin (MULTIVITAMIN) tablet  Take 1 tablet by mouth daily.     .  Omega-3 Fatty Acids (FISH OIL) 1000 MG CAPS  Take 1 capsule by mouth.     Marland Kitchen  UNKNOWN TO PATIENT  Pill to take for infection     .  azelastine (ASTELIN) 137 MCG/SPRAY nasal spray  Place 2 sprays into the nose daily.  Use in each nostril as directed  30 mL  3    No current facility-administered medications for this visit.   Review of Systems  Review of Systems  Constitutional: Negative for fever, chills and unexpected weight change.  HENT: Negative for congestion, hearing loss, sore throat, trouble swallowing and voice change.  Eyes: Negative for visual disturbance.  Respiratory: Negative for cough and wheezing.  Cardiovascular: Negative for chest pain, palpitations and leg swelling.  Gastrointestinal: Negative for nausea, vomiting, abdominal pain, diarrhea, constipation, blood in stool, abdominal distention and anal bleeding.  Genitourinary: Negative for hematuria, vaginal bleeding and difficulty urinating.  Musculoskeletal: Negative for arthralgias.  Skin: Negative for rash and wound.  Neurological: Negative for seizures, syncope and headaches.  Hematological: Negative for adenopathy. Does not bruise/bleed easily.  Psychiatric/Behavioral: Negative for confusion.  Blood pressure 119/72, pulse 70, temperature 98.6 F (37 C), temperature source Oral, resp. rate 16, height 5' 2.5" (1.588 m), weight 95 lb 6.4  oz (43.273 kg), last menstrual period 11/28/2013.  Physical Exam  Physical Exam  Constitutional: She is oriented to person, place, and time. She appears well-developed and well-nourished. No distress.  HENT:  Head: Normocephalic and atraumatic.  Right Ear: External ear normal.  Left Ear: External ear normal.  Nose: Nose normal.  Mouth/Throat: Oropharynx is clear and moist. No oropharyngeal exudate.  Eyes: Conjunctivae are normal. Pupils are equal, round, and reactive to light. Right eye exhibits no discharge. Left eye exhibits no discharge. No scleral icterus.  Neck: Normal range of motion. Neck supple. No tracheal deviation present. No thyromegaly present.  Cardiovascular: Normal rate, regular rhythm, normal heart sounds and intact distal pulses.  No murmur heard.  Pulmonary/Chest: Effort normal  and breath sounds normal. No respiratory distress. She has no wheezes. She has no rales.  Abdominal: Soft. Bowel sounds are normal. She exhibits no distension. There is no tenderness. There is no rebound.  Musculoskeletal: Normal range of motion. She exhibits no edema and no tenderness.  Lymphadenopathy:  She has no cervical adenopathy.  She has no axillary adenopathy.  Neurological: She is alert and oriented to person, place, and time.  Skin: Skin is warm and dry. No rash noted. She is not diaphoretic. No erythema.  Psychiatric: Her behavior is normal. Judgment normal.  Breasts: I cannot palpate a mass in either breast. Specifically, at the 1:00 position of left breast, I cannot palpate a 5 mm mass  Data Reviewed  I have her ultrasound and mammograms demonstrating the 5 mm mass which is irregular at the 1:00 position of the left breast  Assessment  Left breast mass  Plan  I discussed this with the patient through the interpreter. Removal of the mass is recommended for histologic evaluation to rule out malignancy. As I cannot palpate a mass and she is not a candidate for either needle localization or a stereotactic biopsy given her limited breast tissue and the augmentation, I have discussed this with Dr. Marcelo Baldy. He will see the patient preoperatively on the day of surgery and mark where the mass is on the overlying skin so I can proceed with excision through the mark. I discussed the risks of surgery which include but not limited to bleeding, infection, need for further surgery should malignancy be present, damage to the implant, et Ronney Asters. She understands and wishes to proceed. Surgery will be scheduled

## 2014-02-09 NOTE — Progress Notes (Signed)
Lm with pt-talked wirth son who speaks english-have req vietnamese interpreter Will need istat

## 2014-02-10 ENCOUNTER — Ambulatory Visit (HOSPITAL_BASED_OUTPATIENT_CLINIC_OR_DEPARTMENT_OTHER)
Admission: RE | Admit: 2014-02-10 | Discharge: 2014-02-10 | Disposition: A | Discharge status: Home / Self Care | Payer: BC Managed Care – PPO | Source: Ambulatory Visit | Attending: Surgery | Admitting: Surgery

## 2014-02-10 ENCOUNTER — Encounter (HOSPITAL_BASED_OUTPATIENT_CLINIC_OR_DEPARTMENT_OTHER): Payer: Self-pay | Admitting: Anesthesiology

## 2014-02-10 ENCOUNTER — Encounter (HOSPITAL_BASED_OUTPATIENT_CLINIC_OR_DEPARTMENT_OTHER)
Admission: RE | Disposition: A | Discharge status: Home / Self Care | Payer: Self-pay | Source: Ambulatory Visit | Attending: Surgery

## 2014-02-10 ENCOUNTER — Encounter (HOSPITAL_BASED_OUTPATIENT_CLINIC_OR_DEPARTMENT_OTHER): Payer: BC Managed Care – PPO | Admitting: Anesthesiology

## 2014-02-10 ENCOUNTER — Ambulatory Visit (HOSPITAL_BASED_OUTPATIENT_CLINIC_OR_DEPARTMENT_OTHER): Payer: BC Managed Care – PPO | Admitting: Anesthesiology

## 2014-02-10 DIAGNOSIS — F411 Generalized anxiety disorder: Secondary | ICD-10-CM | POA: Insufficient documentation

## 2014-02-10 DIAGNOSIS — D249 Benign neoplasm of unspecified breast: Secondary | ICD-10-CM

## 2014-02-10 DIAGNOSIS — Z79899 Other long term (current) drug therapy: Secondary | ICD-10-CM | POA: Insufficient documentation

## 2014-02-10 DIAGNOSIS — N6019 Diffuse cystic mastopathy of unspecified breast: Secondary | ICD-10-CM | POA: Insufficient documentation

## 2014-02-10 DIAGNOSIS — I10 Essential (primary) hypertension: Secondary | ICD-10-CM | POA: Insufficient documentation

## 2014-02-10 HISTORY — DX: Presence of spectacles and contact lenses: Z97.3

## 2014-02-10 HISTORY — PX: MASS EXCISION: SHX2000

## 2014-02-10 LAB — POCT I-STAT, CHEM 8
BUN: 9 mg/dL (ref 6–23)
Calcium, Ion: 1.05 mmol/L — ABNORMAL LOW (ref 1.12–1.23)
Chloride: 103 mEq/L (ref 96–112)
Creatinine, Ser: 0.7 mg/dL (ref 0.50–1.10)
Glucose, Bld: 99 mg/dL (ref 70–99)
HCT: 43 % (ref 36.0–46.0)
HEMOGLOBIN: 14.6 g/dL (ref 12.0–15.0)
Potassium: 4.8 mEq/L (ref 3.7–5.3)
SODIUM: 137 meq/L (ref 137–147)
TCO2: 28 mmol/L (ref 0–100)

## 2014-02-10 SURGERY — EXCISION MASS
Anesthesia: General | Site: Breast | Laterality: Left

## 2014-02-10 MED ORDER — ONDANSETRON HCL 4 MG/2ML IJ SOLN
INTRAMUSCULAR | Status: DC | PRN
  Administered 2014-02-10: 4 mg via INTRAVENOUS

## 2014-02-10 MED ORDER — SODIUM BICARBONATE 4 % IV SOLN
INTRAVENOUS | Status: AC
  Filled 2014-02-10: qty 5

## 2014-02-10 MED ORDER — BUPIVACAINE-EPINEPHRINE 0.5% -1:200000 IJ SOLN
INTRAMUSCULAR | Status: DC | PRN
  Administered 2014-02-10: 4 mL

## 2014-02-10 MED ORDER — LACTATED RINGERS IV SOLN
INTRAVENOUS | Status: DC
  Administered 2014-02-10: 10:00:00 via INTRAVENOUS

## 2014-02-10 MED ORDER — BUPIVACAINE-EPINEPHRINE (PF) 0.5% -1:200000 IJ SOLN
INTRAMUSCULAR | Status: AC
  Filled 2014-02-10: qty 30

## 2014-02-10 MED ORDER — MIDAZOLAM HCL 5 MG/5ML IJ SOLN
INTRAMUSCULAR | Status: DC | PRN
  Administered 2014-02-10: 2 mg via INTRAVENOUS

## 2014-02-10 MED ORDER — FENTANYL CITRATE 0.05 MG/ML IJ SOLN
INTRAMUSCULAR | Status: DC | PRN
  Administered 2014-02-10: 100 ug via INTRAVENOUS

## 2014-02-10 MED ORDER — HYDROMORPHONE HCL PF 1 MG/ML IJ SOLN: 0.2500 mg | INTRAMUSCULAR | Status: DC | PRN

## 2014-02-10 MED ORDER — FENTANYL CITRATE 0.05 MG/ML IJ SOLN: 50.0000 ug | INTRAMUSCULAR | Status: DC | PRN

## 2014-02-10 MED ORDER — MIDAZOLAM HCL 2 MG/2ML IJ SOLN: 1.0000 mg | INTRAMUSCULAR | Status: DC | PRN

## 2014-02-10 MED ORDER — ONDANSETRON HCL 4 MG/2ML IJ SOLN: 4.0000 mg | Freq: Once | INTRAMUSCULAR | Status: DC | PRN

## 2014-02-10 MED ORDER — CEFAZOLIN SODIUM-DEXTROSE 2-3 GM-% IV SOLR
2.0000 g | INTRAVENOUS | Status: AC
  Administered 2014-02-10: 2 g via INTRAVENOUS

## 2014-02-10 MED ORDER — PROPOFOL 10 MG/ML IV BOLUS
INTRAVENOUS | Status: DC | PRN
  Administered 2014-02-10: 200 mg via INTRAVENOUS

## 2014-02-10 MED ORDER — LIDOCAINE HCL (PF) 1 % IJ SOLN
INTRAMUSCULAR | Status: AC
  Filled 2014-02-10: qty 30

## 2014-02-10 MED ORDER — MIDAZOLAM HCL 2 MG/2ML IJ SOLN
INTRAMUSCULAR | Status: AC
  Filled 2014-02-10: qty 2

## 2014-02-10 MED ORDER — HYDROCODONE-ACETAMINOPHEN 5-325 MG PO TABS: 1.0000 | ORAL_TABLET | ORAL | Status: DC | PRN

## 2014-02-10 MED ORDER — FENTANYL CITRATE 0.05 MG/ML IJ SOLN
INTRAMUSCULAR | Status: AC
  Filled 2014-02-10: qty 6

## 2014-02-10 MED ORDER — OXYCODONE HCL 5 MG PO TABS: 5.0000 mg | ORAL_TABLET | Freq: Once | ORAL | Status: DC | PRN

## 2014-02-10 MED ORDER — LIDOCAINE HCL (CARDIAC) 20 MG/ML IV SOLN
INTRAVENOUS | Status: DC | PRN
  Administered 2014-02-10: 50 mg via INTRAVENOUS

## 2014-02-10 MED ORDER — CEFAZOLIN SODIUM-DEXTROSE 2-3 GM-% IV SOLR
INTRAVENOUS | Status: AC
  Filled 2014-02-10: qty 50

## 2014-02-10 MED ORDER — DEXAMETHASONE SODIUM PHOSPHATE 4 MG/ML IJ SOLN
INTRAMUSCULAR | Status: DC | PRN
  Administered 2014-02-10: 10 mg via INTRAVENOUS

## 2014-02-10 MED ORDER — BACITRACIN-NEOMYCIN-POLYMYXIN 400-5-5000 EX OINT
TOPICAL_OINTMENT | CUTANEOUS | Status: AC
  Filled 2014-02-10: qty 1

## 2014-02-10 MED ORDER — OXYCODONE HCL 5 MG/5ML PO SOLN: 5.0000 mg | Freq: Once | ORAL | Status: DC | PRN

## 2014-02-10 SURGICAL SUPPLY — 47 items
BENZOIN TINCTURE PRP APPL 2/3 (GAUZE/BANDAGES/DRESSINGS) ×3 IMPLANT
BLADE 15 SAFETY STRL DISP (BLADE) IMPLANT
BLADE HEX COATED 2.75 (ELECTRODE) ×3 IMPLANT
BLADE SURG 15 STRL LF DISP TIS (BLADE) ×1 IMPLANT
BLADE SURG 15 STRL SS (BLADE) ×2
BLADE SURG ROTATE 9660 (MISCELLANEOUS) IMPLANT
CANISTER SUCT 1200ML W/VALVE (MISCELLANEOUS) IMPLANT
CHLORAPREP W/TINT 26ML (MISCELLANEOUS) ×3 IMPLANT
CLOSURE WOUND 1/2 X4 (GAUZE/BANDAGES/DRESSINGS) ×1
COVER MAYO STAND STRL (DRAPES) ×3 IMPLANT
COVER TABLE BACK 60X90 (DRAPES) ×3 IMPLANT
DECANTER SPIKE VIAL GLASS SM (MISCELLANEOUS) ×3 IMPLANT
DRAPE PED LAPAROTOMY (DRAPES) ×3 IMPLANT
DRAPE UTILITY XL STRL (DRAPES) ×3 IMPLANT
DRSG TEGADERM 4X4.75 (GAUZE/BANDAGES/DRESSINGS) ×3 IMPLANT
ELECT REM PT RETURN 9FT ADLT (ELECTROSURGICAL) ×3
ELECTRODE REM PT RTRN 9FT ADLT (ELECTROSURGICAL) ×1 IMPLANT
GLOVE BIO SURGEON STRL SZ7.5 (GLOVE) ×3 IMPLANT
GLOVE BIOGEL PI IND STRL 8 (GLOVE) ×1 IMPLANT
GLOVE BIOGEL PI INDICATOR 8 (GLOVE) ×2
GLOVE SURG SIGNA 7.5 PF LTX (GLOVE) ×3 IMPLANT
GOWN STRL REUS W/ TWL LRG LVL3 (GOWN DISPOSABLE) ×1 IMPLANT
GOWN STRL REUS W/ TWL XL LVL3 (GOWN DISPOSABLE) ×1 IMPLANT
GOWN STRL REUS W/TWL LRG LVL3 (GOWN DISPOSABLE) ×2
GOWN STRL REUS W/TWL XL LVL3 (GOWN DISPOSABLE) ×2
NEEDLE HYPO 25X1 1.5 SAFETY (NEEDLE) ×3 IMPLANT
NS IRRIG 1000ML POUR BTL (IV SOLUTION) ×3 IMPLANT
PACK BASIN DAY SURGERY FS (CUSTOM PROCEDURE TRAY) ×3 IMPLANT
PENCIL BUTTON HOLSTER BLD 10FT (ELECTRODE) ×3 IMPLANT
SLEEVE SCD COMPRESS KNEE MED (MISCELLANEOUS) ×3 IMPLANT
SPONGE GAUZE 4X4 12PLY STER LF (GAUZE/BANDAGES/DRESSINGS) ×3 IMPLANT
SPONGE LAP 4X18 X RAY DECT (DISPOSABLE) ×3 IMPLANT
STRIP CLOSURE SKIN 1/2X4 (GAUZE/BANDAGES/DRESSINGS) ×2 IMPLANT
SUT MNCRL AB 4-0 PS2 18 (SUTURE) ×3 IMPLANT
SUT PROLENE 3 0 PS 2 (SUTURE) IMPLANT
SUT VIC AB 2-0 SH 27 (SUTURE) ×2
SUT VIC AB 2-0 SH 27XBRD (SUTURE) ×1 IMPLANT
SUT VIC AB 3-0 SH 27 (SUTURE)
SUT VIC AB 3-0 SH 27X BRD (SUTURE) IMPLANT
SYR BULB 3OZ (MISCELLANEOUS) IMPLANT
SYR CONTROL 10ML LL (SYRINGE) ×3 IMPLANT
TOWEL OR 17X24 6PK STRL BLUE (TOWEL DISPOSABLE) ×3 IMPLANT
TOWEL OR NON WOVEN STRL DISP B (DISPOSABLE) ×3 IMPLANT
TRAY DSU PREP LF (CUSTOM PROCEDURE TRAY) IMPLANT
TUBE CONNECTING 20'X1/4 (TUBING)
TUBE CONNECTING 20X1/4 (TUBING) IMPLANT
YANKAUER SUCT BULB TIP NO VENT (SUCTIONS) IMPLANT

## 2014-02-10 NOTE — Op Note (Signed)
EXCISION LEFT BREAST MASS  Procedure Note  Latoya Lawson 02/10/2014   Pre-op Diagnosis: left breast mass     Post-op Diagnosis: same  Procedure(s): EXCISION LEFT BREAST MASS  Surgeon(s): Harl Bowie, MD  Anesthesia: Choice  Staff:  Circulator: Irineo Axon Bouchillon, RN Scrub Person: Burt Ek, RN  Estimated Blood Loss: Minimal               Specimens: sent to path          Harl Bowie   Date: 02/10/2014  Time: 10:17 AM

## 2014-02-10 NOTE — Interval H&P Note (Signed)
History and Physical Interval Note: no change in H and P  02/10/2014 9:01 AM  [name]  has presented today for surgery, with the diagnosis of left breast mass  The various methods of treatment have been discussed with the patient and family. After consideration of risks, benefits and other options for treatment, the patient has consented to  Procedure(s): EXCISION LEFT BREAST MASS (Left) as a surgical intervention .  The patient's history has been reviewed, patient examined, no change in status, stable for surgery.  I have reviewed the patient's chart and labs.  Questions were answered to the patient's satisfaction.     Latoya Lawson

## 2014-02-10 NOTE — Transfer of Care (Signed)
Immediate Anesthesia Transfer of Care Note  Patient: Latoya Lawson  Procedure(s) Performed: Procedure(s): EXCISION LEFT BREAST MASS (Left)  Patient Location: PACU  Anesthesia Type:General  Level of Consciousness: sedated  Airway & Oxygen Therapy: Patient Spontanous Breathing and Patient connected to face mask oxygen  Post-op Assessment: Report given to PACU RN and Post -op Vital signs reviewed and stable  Post vital signs: Reviewed and stable  Complications: No apparent anesthesia complications

## 2014-02-10 NOTE — Anesthesia Procedure Notes (Signed)
Procedure Name: LMA Insertion Date/Time: 02/10/2014 9:50 AM Performed by: Lieutenant Diego Pre-anesthesia Checklist: Patient identified, Emergency Drugs available, Suction available and Patient being monitored Patient Re-evaluated:Patient Re-evaluated prior to inductionOxygen Delivery Method: Circle System Utilized Preoxygenation: Pre-oxygenation with 100% oxygen Intubation Type: IV induction Ventilation: Mask ventilation without difficulty LMA: LMA inserted LMA Size: 3.0 Number of attempts: 1 Airway Equipment and Method: bite block Placement Confirmation: positive ETCO2 and breath sounds checked- equal and bilateral Tube secured with: Tape Dental Injury: Teeth and Oropharynx as per pre-operative assessment

## 2014-02-10 NOTE — Anesthesia Postprocedure Evaluation (Signed)
  Anesthesia Post-op Note  Patient: Latoya Lawson  Procedure(s) Performed: Procedure(s): EXCISION LEFT BREAST MASS (Left)  Patient Location: PACU  Anesthesia Type:General  Level of Consciousness: awake, alert  and oriented  Airway and Oxygen Therapy: Patient Spontanous Breathing  Post-op Pain: none  Post-op Assessment: Post-op Vital signs reviewed  Post-op Vital Signs: Reviewed  Last Vitals:  Filed Vitals:   02/10/14 1100  BP: 154/88  Pulse: 74  Temp:   Resp: 12    Complications: No apparent anesthesia complications

## 2014-02-10 NOTE — Anesthesia Preprocedure Evaluation (Signed)
Anesthesia Evaluation  Patient identified by MRN, date of birth, ID band Patient awake    Reviewed: Allergy & Precautions, H&P , NPO status , Patient's Chart, lab work & pertinent test results  Airway Mallampati: I  TM Distance: >3 FB Neck ROM: Full    Dental  (+) Teeth Intact, Dental Advisory Given   Pulmonary  breath sounds clear to auscultation        Cardiovascular hypertension, Pt. on medications Rhythm:Regular Rate:Normal     Neuro/Psych    GI/Hepatic   Endo/Other    Renal/GU      Musculoskeletal   Abdominal   Peds  Hematology   Anesthesia Other Findings   Reproductive/Obstetrics                            Anesthesia Physical Anesthesia Plan  ASA: II  Anesthesia Plan: General   Post-op Pain Management:    Induction: Intravenous  Airway Management Planned: LMA  Additional Equipment:   Intra-op Plan:   Post-operative Plan: Extubation in OR  Informed Consent: I have reviewed the patients History and Physical, chart, labs and discussed the procedure including the risks, benefits and alternatives for the proposed anesthesia with the patient or authorized representative who has indicated his/her understanding and acceptance.   Dental advisory given  Plan Discussed with: CRNA, Anesthesiologist and Surgeon  Anesthesia Plan Comments:         Anesthesia Quick Evaluation  

## 2014-02-10 NOTE — Discharge Instructions (Signed)
[place] Office Phone Number [phone]  BREAST BIOPSY/ LumpectomyY: POST OP INSTRUCTIONS  Always review your discharge instruction sheet given to you by the facility where your surgery was performed.  IF YOU HAVE DISABILITY OR FAMILY LEAVE FORMS, YOU MUST BRING THEM TO THE OFFICE FOR PROCESSING.  DO NOT GIVE THEM TO YOUR DOCTOR.  1. A prescription for pain medication may be given to you upon discharge.  Take your pain medication as prescribed, if needed.  If narcotic pain medicine is not needed, then you may take acetaminophen (Tylenol) or ibuprofen (Advil) as needed. 2. Take your usually prescribed medications unless otherwise directed 3. If you need a refill on your pain medication, please contact your pharmacy.  They will contact our office to request authorization.  Prescriptions will not be filled after 5pm or on week-ends. 4. You should eat very light the first 24 hours after surgery, such as soup, crackers, pudding, etc.  Resume your normal diet the day after surgery. 5. Most patients will experience some swelling and bruising in the breast.  Ice packs and a good support bra will help.  Swelling and bruising can take several days to resolve.  6. It is common to experience some constipation if taking pain medication after surgery.  Increasing fluid intake and taking a stool softener will usually help or prevent this problem from occurring.  A mild laxative (Milk of Magnesia or Miralax) should be taken according to package directions if there are no bowel movements after 48 hours. 7. Unless discharge instructions indicate otherwise, you may remove your bandages 24-48 hours after surgery, and you may shower at that time.  You may have steri-strips (small skin tapes) in place directly over the incision.  These strips should be left on the skin for 7-10 days.  If your surgeon used skin glue on the incision, you may shower in 24 hours.  The glue will flake off over the next 2-3  weeks.  Any sutures or staples will be removed at the office during your follow-up visit. 8. ACTIVITIES:  You may resume regular daily activities (gradually increasing) beginning the next day.  Wearing a good support bra or sports bra minimizes pain and swelling.  You may have sexual intercourse when it is comfortable. a. You may drive when you no longer are taking prescription pain medication, you can comfortably wear a seatbelt, and you can safely maneuver your car and apply brakes. b. RETURN TO WORK:  ______________________________________________________________________________________ 9. You should see your doctor in the office for a follow-up appointment approximately two weeks after your surgery.  Your doctors nurse will typically make your follow-up appointment when she calls you with your pathology report.  Expect your pathology report 2-3 business days after your surgery.  You may call to check if you do not hear from [place] after three days. 10. OTHER INSTRUCTIONS: _______________________________________________________________________________________________ _____________________________________________________________________________________________________________________________________ _____________________________________________________________________________________________________________________________________ _____________________________________________________________________________________________________________________________________  WHEN TO CALL YOUR DOCTOR: 1. Fever over 101.0 2. Nausea and/or vomiting. 3. Extreme swelling or bruising. 4. Continued bleeding from incision. 5. Increased pain, redness, or drainage from the incision.  The clinic staff is available to answer your questions during regular business hours.  Please dont hesitate to call and ask to speak to one of the nurses for clinical concerns.  If you have a medical emergency, go to the nearest emergency  room or call 911.  A surgeon from [facility] is always on call at the hospital.  For further questions, please visit [email or web address]  Post  Anesthesia Home Care Instructions ° °Activity: °Get plenty of rest for the remainder of the day. A responsible adult should stay with you for 24 hours following the procedure.  °For the next 24 hours, DO NOT: °-Drive a car °-Operate machinery °-Drink alcoholic beverages °-Take any medication unless instructed by your physician °-Make any legal decisions or sign important papers. ° °Meals: °Start with liquid foods such as gelatin or soup. Progress to regular foods as tolerated. Avoid greasy, spicy, heavy foods. If nausea and/or vomiting occur, drink only clear liquids until the nausea and/or vomiting subsides. Call your physician if vomiting continues. ° °Special Instructions/Symptoms: °Your throat may feel dry or sore from the anesthesia or the breathing tube placed in your throat during surgery. If this causes discomfort, gargle with warm salt water. The discomfort should disappear within 24 hours. ° °

## 2014-02-11 NOTE — Op Note (Signed)
Latoya Lawson, Latoya Lawson                     ACCOUNT NO.:  [id number]  MEDICAL RECORD NO.:  [id number]  LOCATION:                                 FACILITY:  PHYSICIAN:  Coralie Keens, M.D. DATE OF BIRTH:  03/04/1966  DATE OF PROCEDURE:  02/10/2014 DATE OF DISCHARGE:  02/10/2014                              OPERATIVE REPORT   PREOPERATIVE DIAGNOSIS:  Left breast mass.  POSTOPERATIVE DIAGNOSIS:  Left breast mass.  PROCEDURE:  Excision of left breast mass.  SURGEON:  Coralie Keens, M.D.  ANESTHESIA:  General and 0.5% Marcaine.  ESTIMATED BLOOD LOSS:  Minimal.  INDICATIONS:  This is a 48 year old female, who does not speak [place]. She is Asian.  She has had previous breast implants.  There was a questionable nodule at the 1 o'clock position over the left breast. Mammogram was unremarkable.  Ultrasound did show a 5-mm irregular nodule.  Decision was made to proceed with excision of this mass.  PROCEDURE IN DETAIL:  Preoperatively, the patient was marked by the radiologist, Dr. Ancil Boozer with an X over the spot of the visible mass on ultrasound.  A localization wire was not placed as she was quite thin and had suprapectoral breast implants.  She was then taken to the operating room and general anesthesia was induced.  Her left breast was then prepped and draped in usual sterile fashion.  I anesthetized the skin on the lateral edge of the areola near the marked site with Marcaine.  I then made a circumareolar incision with scalpel, I took this down to the breast tissue with the cautery.  Again, she had minimal breast tissue overlying the implant.  I could feel the small nodule and excised it in its entirety with the electrocautery.  The nodule was then sent to Pathology for evaluation.  Hemostasis was achieved with cautery. I anesthetized the wound further with Marcaine.  I closed the subcutaneous tissue with interrupted 3-0 Vicryl sutures and closed the skin with running 4-0 Monocryl.   Steri-Strips, Tegaderm, and gauze were then applied.  The patient tolerated the procedure well. All the counts were correct at the end of the procedure.  The patient was then extubated in the operating room and taken in a stable condition to the recovery room.     Coralie Keens, M.D.   ______________________________ Coralie Keens, M.D.    DB/MEDQ  D:  02/10/2014  T:  02/11/2014  Job:  [id number]

## 2014-02-12 ENCOUNTER — Other Ambulatory Visit: Payer: BC Managed Care – PPO

## 2014-02-12 ENCOUNTER — Encounter (HOSPITAL_BASED_OUTPATIENT_CLINIC_OR_DEPARTMENT_OTHER): Payer: Self-pay | Admitting: Surgery

## 2014-02-16 ENCOUNTER — Ambulatory Visit: Payer: BC Managed Care – PPO | Admitting: Family Medicine

## 2014-02-16 DIAGNOSIS — Z0289 Encounter for other administrative examinations: Secondary | ICD-10-CM

## 2014-02-17 ENCOUNTER — Ambulatory Visit: Payer: BC Managed Care – PPO | Admitting: Endocrinology

## 2014-02-24 ENCOUNTER — Encounter (INDEPENDENT_AMBULATORY_CARE_PROVIDER_SITE_OTHER): Payer: Self-pay

## 2014-03-09 ENCOUNTER — Encounter (INDEPENDENT_AMBULATORY_CARE_PROVIDER_SITE_OTHER): Payer: BC Managed Care – PPO | Admitting: Surgery

## 2014-03-30 ENCOUNTER — Ambulatory Visit (INDEPENDENT_AMBULATORY_CARE_PROVIDER_SITE_OTHER): Payer: BC Managed Care – PPO | Admitting: Surgery

## 2014-03-30 ENCOUNTER — Encounter (INDEPENDENT_AMBULATORY_CARE_PROVIDER_SITE_OTHER): Payer: Self-pay | Admitting: Surgery

## 2014-03-30 VITALS — BP 124/78 | HR 82 | Temp 98.2°F | Ht 65.0 in | Wt 99.0 lb

## 2014-03-30 DIAGNOSIS — Z09 Encounter for follow-up examination after completed treatment for conditions other than malignant neoplasm: Secondary | ICD-10-CM

## 2014-03-30 NOTE — Progress Notes (Signed)
Subjective:     Patient ID: Latoya Lawson, female   DOB: 1966-07-24, 48 y.o.   MRN: [id number]  HPI She is here for her first postop visit. She is accompanied by an interpreter. She has no complaints.  Review of Systems     Objective:   Physical Exam On exam, her incision is well-healed on the left breast.  There is no evidence of infection. The final pathology showed a benign fibroadenoma    Assessment:     Patient stable postop     Plan:     I explained the diagnosis to her through the interpreter. No further workup or general surgical care is needed. I will see her back as needed. She may resume her normal activities.

## 2014-08-03 ENCOUNTER — Encounter (INDEPENDENT_AMBULATORY_CARE_PROVIDER_SITE_OTHER): Payer: Self-pay | Admitting: Surgery

## 2014-09-15 ENCOUNTER — Encounter: Payer: Self-pay | Admitting: Family Medicine

## 2014-09-15 ENCOUNTER — Ambulatory Visit (INDEPENDENT_AMBULATORY_CARE_PROVIDER_SITE_OTHER): Payer: BC Managed Care – PPO | Admitting: Family Medicine

## 2014-09-15 VITALS — BP 120/78 | HR 73 | Temp 97.8°F | Wt 98.2 lb

## 2014-09-15 DIAGNOSIS — I10 Essential (primary) hypertension: Secondary | ICD-10-CM

## 2014-09-15 MED ORDER — LOSARTAN POTASSIUM 50 MG PO TABS: 50.0000 mg | ORAL_TABLET | Freq: Every day | ORAL | Status: DC

## 2014-09-15 NOTE — Patient Instructions (Signed)

## 2014-09-15 NOTE — Progress Notes (Signed)
  Subjective:    Patient here for follow-up of elevated blood pressure.  She is not ": exercising and is adherent to a low-salt diet.  Blood pressure is well controlled at home. Cardiac symptoms: none. Patient denies: chest pain, chest pressure/discomfort, claudication, dyspnea, exertional chest pressure/discomfort, fatigue, irregular heart beat, lower extremity edema, near-syncope, orthopnea, palpitations, paroxysmal nocturnal dyspnea, syncope and tachypnea. Cardiovascular risk factors: hypertension. Use of agents associated with hypertension: none. History of target organ damage: none.  Pt does c/o cough x 1 year.   The following portions of the patient's history were reviewed and updated as appropriate: allergies, current medications, past family history, past medical history, past social history, past surgical history and problem list.  Review of Systems Pertinent items are noted in HPI.     Objective:    BP 120/78 mmHg  Pulse 73  Temp(Src) 97.8 F (36.6 C) (Oral)  Wt 98 lb 3.2 oz (44.543 kg)  SpO2 97% General appearance: alert, cooperative, appears stated age and no distress Neck: no adenopathy, supple, symmetrical, trachea midline and thyroid not enlarged, symmetric, no tenderness/mass/nodules Lungs: clear to auscultation bilaterally Heart: S1, S2 normal Extremities: extremities normal, atraumatic, no cyanosis or edema    Assessment:    Hypertension, normal blood pressure -- cough with ace inh. Evidence of target organ damage: none.    Plan:    Medication: discontinue lisinopril and begin cozaar. Dietary sodium restriction. Follow up: 3 weeks and as needed.

## 2014-09-15 NOTE — Progress Notes (Signed)
Pre visit review using our clinic review tool, if applicable. No additional management support is needed unless otherwise documented below in the visit note. 

## 2014-10-12 ENCOUNTER — Ambulatory Visit: Payer: BLUE CROSS/BLUE SHIELD | Admitting: Family Medicine

## 2014-10-23 ENCOUNTER — Ambulatory Visit: Payer: BLUE CROSS/BLUE SHIELD | Admitting: Family Medicine

## 2014-10-27 ENCOUNTER — Telehealth: Payer: Self-pay | Admitting: *Deleted

## 2014-10-27 ENCOUNTER — Ambulatory Visit: Payer: BLUE CROSS/BLUE SHIELD | Admitting: Family Medicine

## 2014-10-27 NOTE — Telephone Encounter (Signed)
Please call.

## 2014-10-27 NOTE — Telephone Encounter (Signed)
Pt did not show for appointment 10/27/14 at 10:15am for 3 week follow up for bp check.  Charge no show fee?

## 2014-10-29 NOTE — Telephone Encounter (Signed)
Contacted PT- no answer

## 2014-11-02 NOTE — Telephone Encounter (Signed)
See note below

## 2014-11-02 NOTE — Telephone Encounter (Signed)
charge 

## 2014-11-02 NOTE — Telephone Encounter (Signed)
Pt contacted 10/29/14, no answer.  Has not called to reschedule.  Charge no show fee?

## 2014-11-04 NOTE — Telephone Encounter (Signed)
VM left on cell phone by the [place] interpreter to call the office to schedule an apt with Dr.Lowne.     KP

## 2014-12-02 ENCOUNTER — Telehealth: Payer: Self-pay | Admitting: Family Medicine

## 2014-12-02 DIAGNOSIS — I10 Essential (primary) hypertension: Secondary | ICD-10-CM

## 2014-12-02 NOTE — Telephone Encounter (Signed)
Caller name:Fuhs, Ha Relation to XY:VOPFYT Call back [phone] Pharmacy:[facility] Reason for call: pt is completely out of rx losartan (COZAAR) 50 MG tablet, pt has appt on 3/15 at 9:15, would like to know if she can get enough meds to last her until she comes for her appt. Pt has missed 2 appts.

## 2014-12-03 MED ORDER — LOSARTAN POTASSIUM 50 MG PO TABS: 50.0000 mg | ORAL_TABLET | Freq: Every day | ORAL | Status: DC

## 2014-12-03 NOTE — Telephone Encounter (Signed)
Rx faxed and message advising Rx sent to the pharmacy.         KP

## 2014-12-03 NOTE — Telephone Encounter (Signed)
1 month only.

## 2014-12-15 ENCOUNTER — Encounter: Payer: Self-pay | Admitting: Family Medicine

## 2014-12-15 ENCOUNTER — Ambulatory Visit (INDEPENDENT_AMBULATORY_CARE_PROVIDER_SITE_OTHER): Payer: BLUE CROSS/BLUE SHIELD | Admitting: Family Medicine

## 2014-12-15 VITALS — BP 120/76 | HR 72 | Temp 97.8°F | Wt 100.0 lb

## 2014-12-15 DIAGNOSIS — L659 Nonscarring hair loss, unspecified: Secondary | ICD-10-CM

## 2014-12-15 DIAGNOSIS — I10 Essential (primary) hypertension: Secondary | ICD-10-CM

## 2014-12-15 LAB — CBC WITH DIFFERENTIAL/PLATELET
BASOS PCT: 0.4 % (ref 0.0–3.0)
Basophils Absolute: 0 10*3/uL (ref 0.0–0.1)
EOS ABS: 0 10*3/uL (ref 0.0–0.7)
Eosinophils Relative: 0.3 % (ref 0.0–5.0)
HCT: 37.3 % (ref 36.0–46.0)
Hemoglobin: 12.5 g/dL (ref 12.0–15.0)
Lymphocytes Relative: 30.8 % (ref 12.0–46.0)
Lymphs Abs: 1.8 10*3/uL (ref 0.7–4.0)
MCHC: 33.4 g/dL (ref 30.0–36.0)
MCV: 82 fl (ref 78.0–100.0)
Monocytes Absolute: 0.5 10*3/uL (ref 0.1–1.0)
Monocytes Relative: 7.8 % (ref 3.0–12.0)
Neutro Abs: 3.6 10*3/uL (ref 1.4–7.7)
Neutrophils Relative %: 60.7 % (ref 43.0–77.0)
PLATELETS: 246 10*3/uL (ref 150.0–400.0)
RBC: 4.55 Mil/uL (ref 3.87–5.11)
RDW: 13.4 % (ref 11.5–15.5)
WBC: 5.9 10*3/uL (ref 4.0–10.5)

## 2014-12-15 LAB — LIPID PANEL
CHOLESTEROL: 176 mg/dL (ref 0–200)
HDL: 73.9 mg/dL (ref >39.00)
LDL CALC: 89 mg/dL (ref 0–99)
NonHDL: 102.1
TRIGLYCERIDES: 64 mg/dL (ref 0.0–149.0)
Total CHOL/HDL Ratio: 2
VLDL: 12.8 mg/dL (ref 0.0–40.0)

## 2014-12-15 LAB — HEPATIC FUNCTION PANEL
ALT: 16 U/L (ref 0–35)
AST: 16 U/L (ref 0–37)
Albumin: 4.7 g/dL (ref 3.5–5.2)
Alkaline Phosphatase: 48 U/L (ref 39–117)
Bilirubin, Direct: 0.1 mg/dL (ref 0.0–0.3)
Total Bilirubin: 0.6 mg/dL (ref 0.2–1.2)
Total Protein: 8 g/dL (ref 6.0–8.3)

## 2014-12-15 LAB — BASIC METABOLIC PANEL
BUN: 16 mg/dL (ref 6–23)
CALCIUM: 9.8 mg/dL (ref 8.4–10.5)
CO2: 29 meq/L (ref 19–32)
Chloride: 104 mEq/L (ref 96–112)
Creatinine, Ser: 0.68 mg/dL (ref 0.40–1.20)
GFR: 98.55 mL/min (ref >60.00)
GLUCOSE: 98 mg/dL (ref 70–99)
Potassium: 4.3 mEq/L (ref 3.5–5.1)
Sodium: 136 mEq/L (ref 135–145)

## 2014-12-15 LAB — POCT URINALYSIS DIPSTICK
BILIRUBIN UA: NEGATIVE
Blood, UA: NEGATIVE
GLUCOSE UA: NEGATIVE
Ketones, UA: NEGATIVE
LEUKOCYTES UA: NEGATIVE
NITRITE UA: NEGATIVE
Protein, UA: NEGATIVE
Spec Grav, UA: >=1.03
Urobilinogen, UA: 0.2
pH, UA: 6

## 2014-12-15 LAB — THYROID PANEL WITH TSH
Free Thyroxine Index: 2 (ref 1.4–3.8)
T3 UPTAKE: 29 % (ref 22–35)
T4, Total: 6.9 ug/dL (ref 4.5–12.0)
TSH: 0.982 u[IU]/mL (ref 0.350–4.500)

## 2014-12-15 MED ORDER — LOSARTAN POTASSIUM 50 MG PO TABS: 50.0000 mg | ORAL_TABLET | Freq: Every day | ORAL | Status: DC

## 2014-12-15 NOTE — Progress Notes (Signed)
Pre visit review using our clinic review tool, if applicable. No additional management support is needed unless otherwise documented below in the visit note. 

## 2014-12-15 NOTE — Progress Notes (Signed)
Patient ID: Latoya Lawson, female    DOB: Aug 05, 1966  Age: 49 y.o. MRN: [id number]    Subjective:  Subjective HPI Latoya Lawson presents for bp check and c/o hair loss  Review of Systems  Constitutional: Negative for activity change, appetite change, fatigue and unexpected weight change.  Respiratory: Negative for cough and shortness of breath.   Cardiovascular: Negative for chest pain and palpitations.  Skin:       Hair loss-- thinning x years  Psychiatric/Behavioral: Negative for behavioral problems and dysphoric mood. The patient is not nervous/anxious.     History Past Medical History  Diagnosis Date  . Hypertension   . Anxiety   . Abnormal uterine bleeding     after depo  . Wears glasses     She has past surgical history that includes Breast enhancement surgery (2088); Nasal sinus surgery; and Mass excision (Left, 02/10/2014).   Her family history includes Diabetes in her father; Hypertension in her brother, father, maternal grandfather, maternal grandmother, mother, paternal grandfather, paternal grandmother, and sister; Stroke in her father.She reports that she has never smoked. She has never used smokeless tobacco. She reports that she drinks alcohol. She reports that she does not use illicit drugs.  Current Outpatient Prescriptions on File Prior to Visit  Medication Sig Dispense Refill  . cetirizine (ZYRTEC) 10 MG tablet Take 1 tablet (10 mg total) by mouth daily. 30 tablet 11  . Multiple Vitamin (MULTIVITAMIN) tablet Take 1 tablet by mouth daily.     No current facility-administered medications on file prior to visit.     Objective:  Objective Physical Exam  Constitutional: She is oriented to person, place, and time. She appears well-developed and well-nourished.  HENT:  Head: Normocephalic and atraumatic. Hair is abnormal.    Eyes: Conjunctivae and EOM are normal.  Neck: Normal range of motion. Neck supple. No JVD present. Carotid bruit is not present. No thyromegaly  present.  Cardiovascular: Normal rate, regular rhythm and normal heart sounds.   No murmur heard. Pulmonary/Chest: Effort normal and breath sounds normal. No respiratory distress. She has no wheezes. She has no rales. She exhibits no tenderness.  Musculoskeletal: She exhibits no edema.  Neurological: She is alert and oriented to person, place, and time.  Psychiatric: She has a normal mood and affect. Thought content normal.   BP 120/76 mmHg  Pulse 72  Temp(Src) 97.8 F (36.6 C) (Oral)  Wt 100 lb (45.36 kg)  SpO2 97%  LMP 11/24/2014 Wt Readings from Last 3 Encounters:  12/15/14 100 lb (45.36 kg)  09/15/14 98 lb 3.2 oz (44.543 kg)  03/30/14 99 lb (44.906 kg)     Lab Results  Component Value Date   WBC 7.7 12/15/2013   HGB 14.6 02/10/2014   HCT 43.0 02/10/2014   PLT 225 12/15/2013   GLUCOSE 99 02/10/2014   CHOL 179 10/10/2013   TRIG 111.0 10/10/2013   HDL 64.00 10/10/2013   LDLCALC 93 10/10/2013   ALT 16 10/10/2013   AST 16 10/10/2013   NA 137 02/10/2014   K 4.8 02/10/2014   CL 103 02/10/2014   CREATININE 0.70 02/10/2014   BUN 9 02/10/2014   CO2 29 10/10/2013   TSH 0.008* 12/15/2013    No results found.   Assessment & Plan:  Plan I am having [name] maintain her multivitamin, cetirizine, and losartan.  Meds ordered this encounter  Medications  . losartan (COZAAR) 50 MG tablet    Sig: Take 1 tablet (50 mg total)  by mouth daily.    Dispense:  90 tablet    Refill:  3    Problem List Items Addressed This Visit    None    Visit Diagnoses    Hair loss    -  Primary    Relevant Orders    Basic metabolic panel    CBC with Differential/Platelet    Hepatic function panel    Lipid panel    POCT urinalysis dipstick    Thyroid Panel With TSH    Essential hypertension        Relevant Medications    losartan (COZAAR) tablet    Other Relevant Orders    Basic metabolic panel    CBC with Differential/Platelet    Hepatic function panel    Lipid panel    POCT  urinalysis dipstick    Thyroid Panel With TSH       Follow-up: Return in about 6 months (around 06/17/2015), or if symptoms worsen or fail to improve, for bp.  Latoya Koyanagi, DO     ":?

## 2014-12-15 NOTE — Assessment & Plan Note (Signed)
Discussed diet with pt through interpreter Will check labs Try biotin Maybe rogaine Consider derm if no improvement

## 2014-12-15 NOTE — Patient Instructions (Addendum)
High Protein Diet A high protein diet means that high protein foods are added to your diet. Getting more protein in the diet is important for a number of reasons. Protein helps the body to build tissue, muscle, and to repair damage. People who have had surgery, injuries such as broken bones, infections, and burns, or illnesses such as cancer, may need more protein in their diet.  SERVING SIZES Measuring foods and serving sizes helps to make sure you are getting the right amount of food. The list below tells how big or small some common serving sizes are.   1 oz.........4 stacked dice.  3 oz........Marland KitchenDeck of cards.  1 tsp.......Marland KitchenTip of little finger.  1 tbs......Marland KitchenMarland KitchenThumb.  2 tbs.......Marland KitchenGolf ball.   cup......Marland KitchenHalf of a fist.  1 cup.......Marland KitchenA fist. FOOD SOURCES OF PROTEIN Listed below are some food sources of protein and the amount of protein they contain. Your Registered Dietitian can calculate how many grams of protein you need for your medical condition. High protein foods can be added to the diet at mealtime or as snacks. Be sure to have at least 1 protein-containing food at each meal and snack to ensure adequate intake.  Meats and Meat Substitutes / Protein (g)  3 oz poultry (chicken, [place]) / 26 g  3 oz tuna, canned in water / 26 g  3 oz fish (cod) / 21 g  3 oz red meat (beef, pork) / 21 g  4 oz tofu / 9 g  1 egg / 6 g   cup egg substitute / 5 g  1 cup dried beans / 15 g  1 cup soy milk / 4 g Dairy / Protein (g)  1 cup milk (skim, 1%, 2%, whole) / 8 g   cup evaporated milk / 9 g  1 cup buttermilk / 8 g  1 cup low-fat plain yogurt / 11 g  1 cup regular plain yogurt / 9 g   cup cottage cheese / 14 g  1 oz cheddar cheese / 7 g Nuts / Protein (g)  2 tbs peanut butter / 8 g  1 oz peanuts / 7 g  2 tbs cashews / 5 g  2 tbs almonds / 5 g Document Released: 09/18/2005 Document Revised: 12/11/2011 Document Reviewed: 06/21/2007 ExitCare Patient Information  2015 [place]. This information is not intended to replace advice given to you by your health care provider. Make sure you discuss any questions you have with your health care provider.   Biotin ---- supplement for hair and nails rogaine shampoo If nothing works and your labs are normal -- we will consider a dermatology referral

## 2014-12-24 ENCOUNTER — Other Ambulatory Visit: Payer: Self-pay

## 2014-12-24 DIAGNOSIS — L659 Nonscarring hair loss, unspecified: Secondary | ICD-10-CM

## 2014-12-29 ENCOUNTER — Encounter: Payer: Self-pay | Admitting: Family Medicine

## 2015-01-08 ENCOUNTER — Other Ambulatory Visit: Payer: Self-pay | Admitting: Family Medicine

## 2015-01-12 ENCOUNTER — Encounter: Payer: Self-pay | Admitting: Emergency Medicine

## 2015-01-12 ENCOUNTER — Telehealth: Payer: Self-pay | Admitting: Emergency Medicine

## 2015-01-12 NOTE — Telephone Encounter (Signed)
Calling patient. Received letter from [facility] that patient has not returned for follow up imaging. Diagnostic Bilateral Imaging needed per [facility]. Patient with hx of Breast Implants. Surgical Excision with Dr. Ninfa Linden 01/2014. Patient due at any time. Can call [facility] to schedule. Called patient. She requests that I speak with son directly and message was left to return my call.

## 2015-01-15 NOTE — Telephone Encounter (Signed)
Spoke with Janett Billow at [facility] (patient care coordinator).  She states that patient only needs screening mammogram in May and she will request radiologist addend report from 01/2014 to reflect need for screening only. Screening mammogram scheduled for 02/09/15 at 1045. Left detailed message for patient's son to advise of appointment information.   Routing to provider for final review. Patient agreeable to disposition. Will close encounter

## 2015-02-09 ENCOUNTER — Ambulatory Visit (INDEPENDENT_AMBULATORY_CARE_PROVIDER_SITE_OTHER): Payer: BLUE CROSS/BLUE SHIELD | Admitting: Certified Nurse Midwife

## 2015-02-09 ENCOUNTER — Encounter: Payer: Self-pay | Admitting: Certified Nurse Midwife

## 2015-02-09 VITALS — BP 104/70 | HR 68 | Resp 16 | Ht 61.75 in | Wt 104.0 lb

## 2015-02-09 DIAGNOSIS — Z01419 Encounter for gynecological examination (general) (routine) without abnormal findings: Secondary | ICD-10-CM | POA: Diagnosis not present

## 2015-02-09 NOTE — Progress Notes (Signed)
49 y.o. [id number] Married  [place] here for annual exam.  Periods normal, no missed periods in last year. Contraception condoms, no issues. Sees PCP yearly for labs and aex. Patient is due for follow up mammogram this month.. Doing monthly exams of breast and has not noted any changes. Sees Dr. Dwyane Dee for Hyperthyroid, no medication at this point. No health issues today. Interpreter with patient today for language issues.   Patient's last menstrual period was 01/14/2015.          Sexually active: Yes.    The current method of family planning is condoms all the time.    Exercising: No.  exercise Smoker:  no  Health Maintenance: Pap: 12-15-13 neg HPV HR neg No history of abnormal paps MMG:  12-16-13 needed additional evaluation, 02-10-14 category 4A:low suspicion of malignancy. Unable to do needle localization due to implant. Skin mark used instead, mass excision all negative Colonoscopy:  none BMD:   none TDaP: 2010 Labs: pcp Self breast exam: done monthly   reports that she has never smoked. She has never used smokeless tobacco. She reports that she drinks alcohol. She reports that she does not use illicit drugs.  Past Medical History  Diagnosis Date  . Hypertension   . Anxiety   . Abnormal uterine bleeding     after depo  . Wears glasses     Past Surgical History  Procedure Laterality Date  . Breast enhancement surgery  2088  . Nasal sinus surgery    . Mass excision Left 02/10/2014    Procedure: EXCISION LEFT BREAST MASS;  Surgeon: Harl Bowie, MD;  Location: [facility];  Service: General;  Laterality: Left;    Current Outpatient Prescriptions  Medication Sig Dispense Refill  . BIOTIN PO Take by mouth daily.    Marland Kitchen losartan (COZAAR) 50 MG tablet Take 1 tablet (50 mg total) by mouth daily. 90 tablet 3  . Omega-3 Fatty Acids (FISH OIL PO) Take by mouth daily.     No current facility-administered medications for this visit.    Family History  Problem  Relation Age of Onset  . Hypertension Mother   . Hypertension Father   . Diabetes Father   . Stroke Father   . Hypertension Sister   . Hypertension Brother   . Hypertension Maternal Grandmother   . Hypertension Maternal Grandfather   . Hypertension Paternal Grandmother   . Hypertension Paternal Grandfather     ROS:  Pertinent items are noted in HPI.  Otherwise, a comprehensive ROS was negative.  Exam:   BP 104/70 mmHg  Pulse 68  Resp 16  Ht 5' 1.75" (1.568 m)  Wt 104 lb (47.174 kg)  BMI 19.19 kg/m2  LMP 01/14/2015 Height: 5' 1.75" (156.8 cm) Ht Readings from Last 3 Encounters:  02/09/15 5' 1.75" (1.568 m)  03/30/14 5\' 5"  (1.651 m)  02/10/14 5\' 2"  (1.575 m)    General appearance: alert, cooperative and appears stated age Head: Normocephalic, without obvious abnormality, atraumatic Neck: no adenopathy, supple, symmetrical, trachea midline and thyroid enlarged bilateral known enlargement Lungs: clear to auscultation bilaterally Breasts: normal appearance, no masses or tenderness, No nipple retraction or dimpling, No nipple discharge or bleeding, No axillary or supraclavicular adenopathy, implants palpated intact Heart: regular rate and rhythm Abdomen: soft, non-tender; no masses,  no organomegaly Extremities: extremities normal, atraumatic, no cyanosis or edema Skin: Skin color, texture, turgor normal. No rashes or lesions Lymph nodes: Cervical, supraclavicular, and axillary nodes normal. No abnormal inguinal  nodes palpated Neurologic: Grossly normal   Pelvic: External genitalia:  no lesions              Urethra:  normal appearing urethra with no masses, tenderness or lesions              Bartholin's and Skene's: normal                 Vagina: normal appearing vagina with normal color and discharge, no lesions              Cervix: normal,non tender no lesions              Pap taken: No. Bimanual Exam:  Uterus:  normal size, contour, position, consistency, mobility,  non-tender              Adnexa: normal adnexa and no mass, fullness, tenderness               Rectovaginal: Confirms               Anus:  normal sphincter tone, no lesions  Chaperone present: Yes  A:  Well Woman with normal exam  Contraception condoms  Left breast mass benign with removal, due for follow up mammogram yearly screening  Hypertension management with PCP  Hyperthyroid with Endocrine management    P:   Reviewed health and wellness pertinent to exam  Follow up mammogram scheduled for patient today  Continue MD follow up as indicated  Pap smear not taken today   counseled on breast self exam, mammography screening, menopause, adequate intake of calcium and vitamin D, diet and exercise  return annually or prn  An After Visit Summary was printed and given to the patient.

## 2015-02-09 NOTE — Progress Notes (Signed)
Schedule patient for Mammogram 02/15/15 at 8:00 am with [facility].

## 2015-02-09 NOTE — Patient Instructions (Signed)

## 2015-02-15 NOTE — Progress Notes (Signed)
Reviewed personally.  M. Suzanne Jayko Voorhees, MD.  

## 2015-06-21 ENCOUNTER — Ambulatory Visit (INDEPENDENT_AMBULATORY_CARE_PROVIDER_SITE_OTHER): Payer: BLUE CROSS/BLUE SHIELD | Admitting: Family Medicine

## 2015-06-21 ENCOUNTER — Encounter: Payer: Self-pay | Admitting: Family Medicine

## 2015-06-21 VITALS — BP 122/70 | HR 90 | Temp 97.9°F | Ht 62.0 in | Wt 99.4 lb

## 2015-06-21 DIAGNOSIS — E0590 Thyrotoxicosis, unspecified without thyrotoxic crisis or storm: Secondary | ICD-10-CM | POA: Insufficient documentation

## 2015-06-21 DIAGNOSIS — I10 Essential (primary) hypertension: Secondary | ICD-10-CM | POA: Diagnosis not present

## 2015-06-21 DIAGNOSIS — Z8639 Personal history of other endocrine, nutritional and metabolic disease: Secondary | ICD-10-CM

## 2015-06-21 DIAGNOSIS — E785 Hyperlipidemia, unspecified: Secondary | ICD-10-CM

## 2015-06-21 LAB — COMPREHENSIVE METABOLIC PANEL
ALBUMIN: 4.4 g/dL (ref 3.5–5.2)
ALT: 15 U/L (ref 0–35)
AST: 16 U/L (ref 0–37)
Alkaline Phosphatase: 52 U/L (ref 39–117)
BILIRUBIN TOTAL: 0.9 mg/dL (ref 0.2–1.2)
BUN: 11 mg/dL (ref 6–23)
CALCIUM: 9.6 mg/dL (ref 8.4–10.5)
CHLORIDE: 101 meq/L (ref 96–112)
CO2: 29 meq/L (ref 19–32)
Creatinine, Ser: 0.7 mg/dL (ref 0.40–1.20)
GFR: 95.1 mL/min (ref >60.00)
Glucose, Bld: 134 mg/dL — ABNORMAL HIGH (ref 70–99)
Potassium: 3 mEq/L — ABNORMAL LOW (ref 3.5–5.1)
SODIUM: 137 meq/L (ref 135–145)
Total Protein: 8 g/dL (ref 6.0–8.3)

## 2015-06-21 LAB — THYROID PANEL WITH TSH
Free Thyroxine Index: 2.4 (ref 1.4–3.8)
T3 Uptake: 33 % (ref 22–35)
T4 TOTAL: 7.3 ug/dL (ref 4.5–12.0)
TSH: 1.377 u[IU]/mL (ref 0.350–4.500)

## 2015-06-21 LAB — LIPID PANEL
CHOL/HDL RATIO: 2
CHOLESTEROL: 186 mg/dL (ref 0–200)
HDL: 76.3 mg/dL (ref >39.00)
LDL CALC: 94 mg/dL (ref 0–99)
NonHDL: 110.04
TRIGLYCERIDES: 80 mg/dL (ref 0.0–149.0)
VLDL: 16 mg/dL (ref 0.0–40.0)

## 2015-06-21 NOTE — Progress Notes (Signed)
Patient ID: Latoya Lawson, female   DOB: November 11, 1965, 49 y.o.   MRN: [id number]   Subjective:    Patient ID: Latoya Lawson, female    DOB: 03-09-1966, 49 y.o.   MRN: [id number]  Chief Complaint  Patient presents with  . Hypertension    6 mo follow up-- Fasting for labs  . Headache    says she sometimes feels a heaviness at the top of her head.    HPI Patient is in today for bp f/u.  She c/o of sometimes having a heaviness in the head in am and bp running high at home at night.   She did not bring her bp cuff.  Heaviness is only occasionally and is not today. Translator is present.   Past Medical History  Diagnosis Date  . Hypertension   . Anxiety   . Abnormal uterine bleeding     after depo  . Wears glasses     Past Surgical History  Procedure Laterality Date  . Breast enhancement surgery  2088  . Nasal sinus surgery    . Mass excision Left 02/10/2014    Procedure: EXCISION LEFT BREAST MASS;  Surgeon: Harl Bowie, MD;  Location: [facility];  Service: General;  Laterality: Left;    Family History  Problem Relation Age of Onset  . Hypertension Mother   . Hypertension Father   . Diabetes Father   . Stroke Father   . Hypertension Sister   . Hypertension Brother   . Hypertension Maternal Grandmother   . Hypertension Maternal Grandfather   . Hypertension Paternal Grandmother   . Hypertension Paternal Grandfather     Social History   Social History  . Marital Status: Married    Spouse Name: N/A  . Number of Children: N/A  . Years of Education: N/A   Occupational History  . Not on file.   Social History Main Topics  . Smoking status: Never Smoker   . Smokeless tobacco: Never Used  . Alcohol Use: Yes     Comment: occ  . Drug Use: No  . Sexual Activity:    Partners: Male    Birth Control/ Protection: Condom   Other Topics Concern  . Not on file   Social History Narrative    Outpatient Prescriptions Prior to Visit  Medication Sig Dispense  Refill  . BIOTIN PO Take by mouth daily.    Marland Kitchen losartan (COZAAR) 50 MG tablet Take 1 tablet (50 mg total) by mouth daily. 90 tablet 3  . Omega-3 Fatty Acids (FISH OIL PO) Take by mouth daily.     No facility-administered medications prior to visit.    Allergies  Allergen Reactions  . Ace Inhibitors Cough    Review of Systems  Constitutional: Negative for fever and malaise/fatigue.  HENT: Negative for congestion.   Eyes: Negative for discharge.  Respiratory: Negative for shortness of breath.   Cardiovascular: Negative for chest pain, palpitations and leg swelling.  Gastrointestinal: Negative for nausea and abdominal pain.  Genitourinary: Negative for dysuria.  Musculoskeletal: Negative for falls.  Skin: Negative for rash.  Neurological: Negative for loss of consciousness and headaches.  Endo/Heme/Allergies: Negative for environmental allergies.  Psychiatric/Behavioral: Negative for depression. The patient is not nervous/anxious.        Objective:    Physical Exam  Constitutional: She is oriented to person, place, and time. She appears well-developed and well-nourished.  HENT:  Head: Normocephalic and atraumatic.  Eyes: Conjunctivae and EOM are normal.  Neck: Normal range of motion. Neck supple. No JVD present. Carotid bruit is not present. No thyromegaly present.  Cardiovascular: Normal rate, regular rhythm and normal heart sounds.   No murmur heard. Pulmonary/Chest: Effort normal and breath sounds normal. No respiratory distress. She has no wheezes. She has no rales. She exhibits no tenderness.  Musculoskeletal: She exhibits no edema.  Neurological: She is alert and oriented to person, place, and time.  Psychiatric: She has a normal mood and affect. Her behavior is normal.  Nursing note and vitals reviewed.   BP 122/70 mmHg  Pulse 90  Temp(Src) 97.9 F (36.6 C) (Oral)  Ht 5' 2"  (1.575 m)  Wt 99 lb 6.4 oz (45.088 kg)  BMI 18.18 kg/m2  SpO2 99%  LMP 05/22/2015 Wt  Readings from Last 3 Encounters:  06/21/15 99 lb 6.4 oz (45.088 kg)  02/09/15 104 lb (47.174 kg)  12/15/14 100 lb (45.36 kg)     Lab Results  Component Value Date   WBC 5.9 12/15/2014   HGB 12.5 12/15/2014   HCT 37.3 12/15/2014   PLT 246.0 12/15/2014   GLUCOSE 98 12/15/2014   CHOL 176 12/15/2014   TRIG 64.0 12/15/2014   HDL 73.90 12/15/2014   LDLCALC 89 12/15/2014   ALT 16 12/15/2014   AST 16 12/15/2014   NA 136 12/15/2014   K 4.3 12/15/2014   CL 104 12/15/2014   CREATININE 0.68 12/15/2014   BUN 16 12/15/2014   CO2 29 12/15/2014   TSH 0.982 12/15/2014    Lab Results  Component Value Date   TSH 0.982 12/15/2014   Lab Results  Component Value Date   WBC 5.9 12/15/2014   HGB 12.5 12/15/2014   HCT 37.3 12/15/2014   MCV 82.0 12/15/2014   PLT 246.0 12/15/2014   Lab Results  Component Value Date   NA 136 12/15/2014   K 4.3 12/15/2014   CO2 29 12/15/2014   GLUCOSE 98 12/15/2014   BUN 16 12/15/2014   CREATININE 0.68 12/15/2014   BILITOT 0.6 12/15/2014   ALKPHOS 48 12/15/2014   AST 16 12/15/2014   ALT 16 12/15/2014   PROT 8.0 12/15/2014   ALBUMIN 4.7 12/15/2014   CALCIUM 9.8 12/15/2014   GFR 98.55 12/15/2014   Lab Results  Component Value Date   CHOL 176 12/15/2014   Lab Results  Component Value Date   HDL 73.90 12/15/2014   Lab Results  Component Value Date   LDLCALC 89 12/15/2014   Lab Results  Component Value Date   TRIG 64.0 12/15/2014   Lab Results  Component Value Date   CHOLHDL 2 12/15/2014   No results found for: HGBA1C     Assessment & Plan:   Problem List Items Addressed This Visit    Hyperthyroidism    Recheck labs today Pt saw endo last year      HTN (hypertension) - Primary    Pt will come in tomorrow with her bp cuff to check accuracy Check labs      Relevant Orders   Comp Met (CMET)    Other Visit Diagnoses    Hyperlipidemia        Relevant Orders    Comp Met (CMET)    Lipid panel    History of hyperthyroidism         Relevant Orders    Thyroid Panel With TSH       I am having [name] maintain her losartan, Omega-3 Fatty Acids (FISH OIL PO), and BIOTIN PO.  No orders of the defined types were placed in this encounter.     Garnet Koyanagi, DO

## 2015-06-21 NOTE — Assessment & Plan Note (Addendum)
Pt will come in tomorrow with her bp cuff to check accuracy Check labs Unsure if bp is related to this head pressure she is feeling

## 2015-06-21 NOTE — Progress Notes (Signed)
Pre visit review using our clinic review tool, if applicable. No additional management support is needed unless otherwise documented below in the visit note. 

## 2015-06-21 NOTE — Assessment & Plan Note (Signed)
Recheck labs today Pt saw endo last year

## 2015-06-21 NOTE — Patient Instructions (Signed)

## 2015-06-23 ENCOUNTER — Ambulatory Visit (INDEPENDENT_AMBULATORY_CARE_PROVIDER_SITE_OTHER): Payer: BLUE CROSS/BLUE SHIELD | Admitting: Medical

## 2015-06-23 ENCOUNTER — Encounter: Payer: Self-pay | Admitting: Medical

## 2015-06-23 ENCOUNTER — Telehealth: Payer: Self-pay | Admitting: Medical

## 2015-06-23 VITALS — BP 142/90 | HR 74

## 2015-06-23 DIAGNOSIS — I10 Essential (primary) hypertension: Secondary | ICD-10-CM

## 2015-06-23 NOTE — Progress Notes (Signed)
   Subjective:    Patient ID: Latoya Lawson, female    DOB: 30-Mar-1966, 49 y.o.   MRN: [id number]  HPI  Pt bp is ok today. She was not taking her meds.This was a nurse visit. Advised to check her bp daily. Document readings. Call us in one week. Continue on the same dose.  Review of Systems     Objective:   Physical Exam        Assessment & Plan:  Will ask [place] to waive charge. This was placed on my schedule by accident. Should be nurse visit only.

## 2015-06-23 NOTE — Telephone Encounter (Signed)
Based on recent bp reading and today bp reading. Advised check bp daily. Call us in one week with readings. Stay on current dose losartan 50 mg q day. Borderline bp readng today. Her  machine appears accurate. May need to increase bp med dose in one week if bp average like today or little higher.

## 2015-06-23 NOTE — Progress Notes (Signed)
Patient in for BP check this am to compare our reading with her BP machine. Her machine = 144/90 Manual BP=142/90. Patient has NOT had BP medications this am. Takes Losartan 50 P reading when in office was 122/70. Patients BP last pm = 136/73 and this am = 136/82.

## 2015-12-16 ENCOUNTER — Other Ambulatory Visit: Payer: Self-pay | Admitting: Family Medicine

## 2015-12-17 ENCOUNTER — Telehealth: Payer: Self-pay | Admitting: Behavioral Health

## 2015-12-17 NOTE — Telephone Encounter (Signed)
Unable to reach patient at time of Pre-Visit Call.  Left message for patient to return call when available.    

## 2015-12-20 ENCOUNTER — Ambulatory Visit (INDEPENDENT_AMBULATORY_CARE_PROVIDER_SITE_OTHER): Payer: BLUE CROSS/BLUE SHIELD | Admitting: Family Medicine

## 2015-12-20 ENCOUNTER — Encounter: Payer: Self-pay | Admitting: Family Medicine

## 2015-12-20 VITALS — BP 146/84 | HR 72 | Temp 97.8°F | Ht 62.0 in | Wt 101.4 lb

## 2015-12-20 DIAGNOSIS — I10 Essential (primary) hypertension: Secondary | ICD-10-CM | POA: Diagnosis not present

## 2015-12-20 DIAGNOSIS — Z114 Encounter for screening for human immunodeficiency virus [HIV]: Secondary | ICD-10-CM

## 2015-12-20 DIAGNOSIS — N926 Irregular menstruation, unspecified: Secondary | ICD-10-CM

## 2015-12-20 DIAGNOSIS — Z0000 Encounter for general adult medical examination without abnormal findings: Secondary | ICD-10-CM | POA: Diagnosis not present

## 2015-12-20 LAB — POCT URINALYSIS DIPSTICK
BILIRUBIN UA: NEGATIVE
Blood, UA: NEGATIVE
Glucose, UA: NEGATIVE
KETONES UA: NEGATIVE
LEUKOCYTES UA: NEGATIVE
Nitrite, UA: NEGATIVE
PH UA: 6
PROTEIN UA: NEGATIVE
Urobilinogen, UA: 0.2

## 2015-12-20 LAB — CBC WITH DIFFERENTIAL/PLATELET
BASOS ABS: 0 10*3/uL (ref 0.0–0.1)
Basophils Relative: 0.6 % (ref 0.0–3.0)
EOS PCT: 0.8 % (ref 0.0–5.0)
Eosinophils Absolute: 0 10*3/uL (ref 0.0–0.7)
HCT: 39.3 % (ref 36.0–46.0)
HEMOGLOBIN: 12.8 g/dL (ref 12.0–15.0)
LYMPHS PCT: 35.1 % (ref 12.0–46.0)
Lymphs Abs: 2 10*3/uL (ref 0.7–4.0)
MCHC: 32.7 g/dL (ref 30.0–36.0)
MCV: 81.1 fl (ref 78.0–100.0)
MONOS PCT: 7.6 % (ref 3.0–12.0)
Monocytes Absolute: 0.4 10*3/uL (ref 0.1–1.0)
Neutro Abs: 3.2 10*3/uL (ref 1.4–7.7)
Neutrophils Relative %: 55.9 % (ref 43.0–77.0)
Platelets: 216 10*3/uL (ref 150.0–400.0)
RBC: 4.84 Mil/uL (ref 3.87–5.11)
RDW: 15 % (ref 11.5–15.5)
WBC: 5.8 10*3/uL (ref 4.0–10.5)

## 2015-12-20 LAB — COMPREHENSIVE METABOLIC PANEL
ALK PHOS: 45 U/L (ref 39–117)
ALT: 14 U/L (ref 0–35)
AST: 18 U/L (ref 0–37)
Albumin: 4.7 g/dL (ref 3.5–5.2)
BILIRUBIN TOTAL: 0.5 mg/dL (ref 0.2–1.2)
BUN: 16 mg/dL (ref 6–23)
CALCIUM: 9.9 mg/dL (ref 8.4–10.5)
CO2: 31 mEq/L (ref 19–32)
Chloride: 101 mEq/L (ref 96–112)
Creatinine, Ser: 0.77 mg/dL (ref 0.40–1.20)
GFR: 85.01 mL/min (ref >60.00)
Glucose, Bld: 104 mg/dL — ABNORMAL HIGH (ref 70–99)
Potassium: 4.1 mEq/L (ref 3.5–5.1)
Sodium: 139 mEq/L (ref 135–145)
TOTAL PROTEIN: 8.1 g/dL (ref 6.0–8.3)

## 2015-12-20 LAB — HIV ANTIBODY (ROUTINE TESTING W REFLEX): HIV 1&2 Ab, 4th Generation: NONREACTIVE

## 2015-12-20 LAB — LIPID PANEL
Cholesterol: 181 mg/dL (ref 0–200)
HDL: 72.2 mg/dL (ref >39.00)
LDL CALC: 92 mg/dL (ref 0–99)
NONHDL: 109.05
Total CHOL/HDL Ratio: 3
Triglycerides: 87 mg/dL (ref 0.0–149.0)
VLDL: 17.4 mg/dL (ref 0.0–40.0)

## 2015-12-20 LAB — TSH: TSH: 1.29 u[IU]/mL (ref 0.35–4.50)

## 2015-12-20 MED ORDER — LOSARTAN POTASSIUM 100 MG PO TABS: 100.0000 mg | ORAL_TABLET | Freq: Every day | ORAL | Status: DC

## 2015-12-20 NOTE — Patient Instructions (Signed)
Preventive Care for Adults, Female A healthy lifestyle and preventive care can promote health and wellness. Preventive health guidelines for women include the following key practices.  A routine yearly physical is a good way to check with your health care provider about your health and preventive screening. It is a chance to share any concerns and updates on your health and to receive a thorough exam.  Visit your dentist for a routine exam and preventive care every 6 months. Brush your teeth twice a day and floss once a day. Good oral hygiene prevents tooth decay and gum disease.  The frequency of eye exams is based on your age, health, family medical history, use of contact lenses, and other factors. Follow your health care provider's recommendations for frequency of eye exams.  Eat a healthy diet. Foods like vegetables, fruits, whole grains, low-fat dairy products, and lean protein foods contain the nutrients you need without too many calories. Decrease your intake of foods high in solid fats, added sugars, and salt. Eat the right amount of calories for you.Get information about a proper diet from your health care provider, if necessary.  Regular physical exercise is one of the most important things you can do for your health. Most adults should get at least 150 minutes of moderate-intensity exercise (any activity that increases your heart rate and causes you to sweat) each week. In addition, most adults need muscle-strengthening exercises on 2 or more days a week.  Maintain a healthy weight. The body mass index (BMI) is a screening tool to identify possible weight problems. It provides an estimate of body fat based on height and weight. Your health care provider can find your BMI and can help you achieve or maintain a healthy weight.For adults 20 years and older:  A BMI below 18.5 is considered underweight.  A BMI of 18.5 to 24.9 is normal.  A BMI of 25 to 29.9 is considered overweight.  A  BMI of 30 and above is considered obese.  Maintain normal blood lipids and cholesterol levels by exercising and minimizing your intake of saturated fat. Eat a balanced diet with plenty of fruit and vegetables. Blood tests for lipids and cholesterol should begin at age 45 and be repeated every 5 years. If your lipid or cholesterol levels are high, you are over 50, or you are at high risk for heart disease, you may need your cholesterol levels checked more frequently.Ongoing high lipid and cholesterol levels should be treated with medicines if diet and exercise are not working.  If you smoke, find out from your health care provider how to quit. If you do not use tobacco, do not start.  Lung cancer screening is recommended for adults aged 45-80 years who are at high risk for developing lung cancer because of a history of smoking. A yearly low-dose CT scan of the lungs is recommended for people who have at least a 30-pack-year history of smoking and are a current smoker or have quit within the past 15 years. A pack year of smoking is smoking an average of 1 pack of cigarettes a day for 1 year (for example: 1 pack a day for 30 years or 2 packs a day for 15 years). Yearly screening should continue until the smoker has stopped smoking for at least 15 years. Yearly screening should be stopped for people who develop a health problem that would prevent them from having lung cancer treatment.  If you are pregnant, do not drink alcohol. If you are  breastfeeding, be very cautious about drinking alcohol. If you are not pregnant and choose to drink alcohol, do not have more than 1 drink per day. One drink is considered to be 12 ounces (355 mL) of beer, 5 ounces (148 mL) of wine, or 1.5 ounces (44 mL) of liquor.  Avoid use of street drugs. Do not share needles with anyone. Ask for help if you need support or instructions about stopping the use of drugs.  High blood pressure causes heart disease and increases the risk  of stroke. Your blood pressure should be checked at least every 1 to 2 years. Ongoing high blood pressure should be treated with medicines if weight loss and exercise do not work.  If you are 55-79 years old, ask your health care provider if you should take aspirin to prevent strokes.  Diabetes screening is done by taking a blood sample to check your blood glucose level after you have not eaten for a certain period of time (fasting). If you are not overweight and you do not have risk factors for diabetes, you should be screened once every 3 years starting at age 45. If you are overweight or obese and you are 40-70 years of age, you should be screened for diabetes every year as part of your cardiovascular risk assessment.  Breast cancer screening is essential preventive care for women. You should practice "breast self-awareness." This means understanding the normal appearance and feel of your breasts and may include breast self-examination. Any changes detected, no matter how small, should be reported to a health care provider. Women in their 20s and 30s should have a clinical breast exam (CBE) by a health care provider as part of a regular health exam every 1 to 3 years. After age 40, women should have a CBE every year. Starting at age 40, women should consider having a mammogram (breast X-ray test) every year. Women who have a family history of breast cancer should talk to their health care provider about genetic screening. Women at a high risk of breast cancer should talk to their health care providers about having an MRI and a mammogram every year.  Breast cancer gene (BRCA)-related cancer risk assessment is recommended for women who have family members with BRCA-related cancers. BRCA-related cancers include breast, ovarian, tubal, and peritoneal cancers. Having family members with these cancers may be associated with an increased risk for harmful changes (mutations) in the breast cancer genes BRCA1 and  BRCA2. Results of the assessment will determine the need for genetic counseling and BRCA1 and BRCA2 testing.  Your health care provider may recommend that you be screened regularly for cancer of the pelvic organs (ovaries, uterus, and vagina). This screening involves a pelvic examination, including checking for microscopic changes to the surface of your cervix (Pap test). You may be encouraged to have this screening done every 3 years, beginning at age 21.  For women ages 30-65, health care providers may recommend pelvic exams and Pap testing every 3 years, or they may recommend the Pap and pelvic exam, combined with testing for human papilloma virus (HPV), every 5 years. Some types of HPV increase your risk of cervical cancer. Testing for HPV may also be done on women of any age with unclear Pap test results.  Other health care providers may not recommend any screening for nonpregnant women who are considered low risk for pelvic cancer and who do not have symptoms. Ask your health care provider if a screening pelvic exam is right for   you.  If you have had past treatment for cervical cancer or a condition that could lead to cancer, you need Pap tests and screening for cancer for at least 20 years after your treatment. If Pap tests have been discontinued, your risk factors (such as having a new sexual partner) need to be reassessed to determine if screening should resume. Some women have medical problems that increase the chance of getting cervical cancer. In these cases, your health care provider may recommend more frequent screening and Pap tests.  Colorectal cancer can be detected and often prevented. Most routine colorectal cancer screening begins at the age of 50 years and continues through age 75 years. However, your health care provider may recommend screening at an earlier age if you have risk factors for colon cancer. On a yearly basis, your health care provider may provide home test kits to check  for hidden blood in the stool. Use of a small camera at the end of a tube, to directly examine the colon (sigmoidoscopy or colonoscopy), can detect the earliest forms of colorectal cancer. Talk to your health care provider about this at age 50, when routine screening begins. Direct exam of the colon should be repeated every 5-10 years through age 75 years, unless early forms of precancerous polyps or small growths are found.  People who are at an increased risk for hepatitis B should be screened for this virus. You are considered at high risk for hepatitis B if:  You were born in a country where hepatitis B occurs often. Talk with your health care provider about which countries are considered high risk.  Your parents were born in a high-risk country and you have not received a shot to protect against hepatitis B (hepatitis B vaccine).  You have HIV or AIDS.  You use needles to inject street drugs.  You live with, or have sex with, someone who has hepatitis B.  You get hemodialysis treatment.  You take certain medicines for conditions like cancer, organ transplantation, and autoimmune conditions.  Hepatitis C blood testing is recommended for all people born from 1945 through 1965 and any individual with known risks for hepatitis C.  Practice safe sex. Use condoms and avoid high-risk sexual practices to reduce the spread of sexually transmitted infections (STIs). STIs include gonorrhea, chlamydia, syphilis, trichomonas, herpes, HPV, and human immunodeficiency virus (HIV). Herpes, HIV, and HPV are viral illnesses that have no cure. They can result in disability, cancer, and death.  You should be screened for sexually transmitted illnesses (STIs) including gonorrhea and chlamydia if:  You are sexually active and are younger than 24 years.  You are older than 24 years and your health care provider tells you that you are at risk for this type of infection.  Your sexual activity has changed  since you were last screened and you are at an increased risk for chlamydia or gonorrhea. Ask your health care provider if you are at risk.  If you are at risk of being infected with HIV, it is recommended that you take a prescription medicine daily to prevent HIV infection. This is called preexposure prophylaxis (PrEP). You are considered at risk if:  You are sexually active and do not regularly use condoms or know the HIV status of your partner(s).  You take drugs by injection.  You are sexually active with a partner who has HIV.  Talk with your health care provider about whether you are at high risk of being infected with HIV. If   you choose to begin PrEP, you should first be tested for HIV. You should then be tested every 3 months for as long as you are taking PrEP.  Osteoporosis is a disease in which the bones lose minerals and strength with aging. This can result in serious bone fractures or breaks. The risk of osteoporosis can be identified using a bone density scan. Women ages 67 years and over and women at risk for fractures or osteoporosis should discuss screening with their health care providers. Ask your health care provider whether you should take a calcium supplement or vitamin D to reduce the rate of osteoporosis.  Menopause can be associated with physical symptoms and risks. Hormone replacement therapy is available to decrease symptoms and risks. You should talk to your health care provider about whether hormone replacement therapy is right for you.  Use sunscreen. Apply sunscreen liberally and repeatedly throughout the day. You should seek shade when your shadow is shorter than you. Protect yourself by wearing long sleeves, pants, a wide-brimmed hat, and sunglasses year round, whenever you are outdoors.  Once a month, do a whole body skin exam, using a mirror to look at the skin on your back. Tell your health care provider of new moles, moles that have irregular borders, moles that  are larger than a pencil eraser, or moles that have changed in shape or color.  Stay current with required vaccines (immunizations).  Influenza vaccine. All adults should be immunized every year.  Tetanus, diphtheria, and acellular pertussis (Td, Tdap) vaccine. Pregnant women should receive 1 dose of Tdap vaccine during each pregnancy. The dose should be obtained regardless of the length of time since the last dose. Immunization is preferred during the 27th-36th week of gestation. An adult who has not previously received Tdap or who does not know her vaccine status should receive 1 dose of Tdap. This initial dose should be followed by tetanus and diphtheria toxoids (Td) booster doses every 10 years. Adults with an unknown or incomplete history of completing a 3-dose immunization series with Td-containing vaccines should begin or complete a primary immunization series including a Tdap dose. Adults should receive a Td booster every 10 years.  Varicella vaccine. An adult without evidence of immunity to varicella should receive 2 doses or a second dose if she has previously received 1 dose. Pregnant females who do not have evidence of immunity should receive the first dose after pregnancy. This first dose should be obtained before leaving the health care facility. The second dose should be obtained 4-8 weeks after the first dose.  Human papillomavirus (HPV) vaccine. Females aged 13-26 years who have not received the vaccine previously should obtain the 3-dose series. The vaccine is not recommended for use in pregnant females. However, pregnancy testing is not needed before receiving a dose. If a female is found to be pregnant after receiving a dose, no treatment is needed. In that case, the remaining doses should be delayed until after the pregnancy. Immunization is recommended for any person with an immunocompromised condition through the age of 61 years if she did not get any or all doses earlier. During the  3-dose series, the second dose should be obtained 4-8 weeks after the first dose. The third dose should be obtained 24 weeks after the first dose and 16 weeks after the second dose.  Zoster vaccine. One dose is recommended for adults aged 30 years or older unless certain conditions are present.  Measles, mumps, and rubella (MMR) vaccine. Adults born  before 1957 generally are considered immune to measles and mumps. Adults born in 1957 or later should have 1 or more doses of MMR vaccine unless there is a contraindication to the vaccine or there is laboratory evidence of immunity to each of the three diseases. A routine second dose of MMR vaccine should be obtained at least 28 days after the first dose for students attending postsecondary schools, health care workers, or international travelers. People who received inactivated measles vaccine or an unknown type of measles vaccine during 1963-1967 should receive 2 doses of MMR vaccine. People who received inactivated mumps vaccine or an unknown type of mumps vaccine before 1979 and are at high risk for mumps infection should consider immunization with 2 doses of MMR vaccine. For females of childbearing age, rubella immunity should be determined. If there is no evidence of immunity, females who are not pregnant should be vaccinated. If there is no evidence of immunity, females who are pregnant should delay immunization until after pregnancy. Unvaccinated health care workers born before 1957 who lack laboratory evidence of measles, mumps, or rubella immunity or laboratory confirmation of disease should consider measles and mumps immunization with 2 doses of MMR vaccine or rubella immunization with 1 dose of MMR vaccine.  Pneumococcal 13-valent conjugate (PCV13) vaccine. When indicated, a person who is uncertain of his immunization history and has no record of immunization should receive the PCV13 vaccine. All adults 65 years of age and older should receive this  vaccine. An adult aged 19 years or older who has certain medical conditions and has not been previously immunized should receive 1 dose of PCV13 vaccine. This PCV13 should be followed with a dose of pneumococcal polysaccharide (PPSV23) vaccine. Adults who are at high risk for pneumococcal disease should obtain the PPSV23 vaccine at least 8 weeks after the dose of PCV13 vaccine. Adults older than 48 years of age who have normal immune system function should obtain the PPSV23 vaccine dose at least 1 year after the dose of PCV13 vaccine.  Pneumococcal polysaccharide (PPSV23) vaccine. When PCV13 is also indicated, PCV13 should be obtained first. All adults aged 65 years and older should be immunized. An adult younger than age 65 years who has certain medical conditions should be immunized. Any person who resides in a nursing home or long-term care facility should be immunized. An adult smoker should be immunized. People with an immunocompromised condition and certain other conditions should receive both PCV13 and PPSV23 vaccines. People with human immunodeficiency virus (HIV) infection should be immunized as soon as possible after diagnosis. Immunization during chemotherapy or radiation therapy should be avoided. Routine use of PPSV23 vaccine is not recommended for American Indians, [place] Natives, or people younger than 65 years unless there are medical conditions that require PPSV23 vaccine. When indicated, people who have unknown immunization and have no record of immunization should receive PPSV23 vaccine. One-time revaccination 5 years after the first dose of PPSV23 is recommended for people aged 19-64 years who have chronic kidney failure, nephrotic syndrome, asplenia, or immunocompromised conditions. People who received 1-2 doses of PPSV23 before age 65 years should receive another dose of PPSV23 vaccine at age 65 years or later if at least 5 years have passed since the previous dose. Doses of PPSV23 are not  needed for people immunized with PPSV23 at or after age 65 years.  Meningococcal vaccine. Adults with asplenia or persistent complement component deficiencies should receive 2 doses of quadrivalent meningococcal conjugate (MenACWY-D) vaccine. The doses should be obtained   at least 2 months apart. Microbiologists working with certain meningococcal bacteria, [place] recruits, people at risk during an outbreak, and people who travel to or live in countries with a high rate of meningitis should be immunized. A first-year college student up through age 34 years who is living in a residence hall should receive a dose if she did not receive a dose on or after her 16th birthday. Adults who have certain high-risk conditions should receive one or more doses of vaccine.  Hepatitis A vaccine. Adults who wish to be protected from this disease, have certain high-risk conditions, work with hepatitis A-infected animals, work in hepatitis A research labs, or travel to or work in countries with a high rate of hepatitis A should be immunized. Adults who were previously unvaccinated and who anticipate close contact with an international adoptee during the first 60 days after arrival in the [place] from a country with a high rate of hepatitis A should be immunized.  Hepatitis B vaccine. Adults who wish to be protected from this disease, have certain high-risk conditions, may be exposed to blood or other infectious body fluids, are household contacts or sex partners of hepatitis B positive people, are clients or workers in certain care facilities, or travel to or work in countries with a high rate of hepatitis B should be immunized.  Haemophilus influenzae type b (Hib) vaccine. A previously unvaccinated person with asplenia or sickle cell disease or having a scheduled splenectomy should receive 1 dose of Hib vaccine. Regardless of previous immunization, a recipient of a hematopoietic stem cell transplant should receive a  3-dose series 6-12 months after her successful transplant. Hib vaccine is not recommended for adults with HIV infection. Preventive Services / Frequency Ages 35 to 4 years  Blood pressure check.** / Every 3-5 years.  Lipid and cholesterol check.** / Every 5 years beginning at age 60.  Clinical breast exam.** / Every 3 years for women in their 71s and 10s.  BRCA-related cancer risk assessment.** / For women who have family members with a BRCA-related cancer (breast, ovarian, tubal, or peritoneal cancers).  Pap test.** / Every 2 years from ages 76 through 26. Every 3 years starting at age 61 through age 76 or 93 with a history of 3 consecutive normal Pap tests.  HPV screening.** / Every 3 years from ages 37 through ages 60 to 51 with a history of 3 consecutive normal Pap tests.  Hepatitis C blood test.** / For any individual with known risks for hepatitis C.  Skin self-exam. / Monthly.  Influenza vaccine. / Every year.  Tetanus, diphtheria, and acellular pertussis (Tdap, Td) vaccine.** / Consult your health care provider. Pregnant women should receive 1 dose of Tdap vaccine during each pregnancy. 1 dose of Td every 10 years.  Varicella vaccine.** / Consult your health care provider. Pregnant females who do not have evidence of immunity should receive the first dose after pregnancy.  HPV vaccine. / 3 doses over 6 months, if 93 and younger. The vaccine is not recommended for use in pregnant females. However, pregnancy testing is not needed before receiving a dose.  Measles, mumps, rubella (MMR) vaccine.** / You need at least 1 dose of MMR if you were born in 1957 or later. You may also need a 2nd dose. For females of childbearing age, rubella immunity should be determined. If there is no evidence of immunity, females who are not pregnant should be vaccinated. If there is no evidence of immunity, females who are  pregnant should delay immunization until after pregnancy.  Pneumococcal  13-valent conjugate (PCV13) vaccine.** / Consult your health care provider.  Pneumococcal polysaccharide (PPSV23) vaccine.** / 1 to 2 doses if you smoke cigarettes or if you have certain conditions.  Meningococcal vaccine.** / 1 dose if you are age 68 to 8 years and a Market researcher living in a residence hall, or have one of several medical conditions, you need to get vaccinated against meningococcal disease. You may also need additional booster doses.  Hepatitis A vaccine.** / Consult your health care provider.  Hepatitis B vaccine.** / Consult your health care provider.  Haemophilus influenzae type b (Hib) vaccine.** / Consult your health care provider. Ages 7 to 53 years  Blood pressure check.** / Every year.  Lipid and cholesterol check.** / Every 5 years beginning at age 25 years.  Lung cancer screening. / Every year if you are aged 11-80 years and have a 30-pack-year history of smoking and currently smoke or have quit within the past 15 years. Yearly screening is stopped once you have quit smoking for at least 15 years or develop a health problem that would prevent you from having lung cancer treatment.  Clinical breast exam.** / Every year after age 48 years.  BRCA-related cancer risk assessment.** / For women who have family members with a BRCA-related cancer (breast, ovarian, tubal, or peritoneal cancers).  Mammogram.** / Every year beginning at age 41 years and continuing for as long as you are in good health. Consult with your health care provider.  Pap test.** / Every 3 years starting at age 65 years through age 37 or 70 years with a history of 3 consecutive normal Pap tests.  HPV screening.** / Every 3 years from ages 72 years through ages 60 to 40 years with a history of 3 consecutive normal Pap tests.  Fecal occult blood test (FOBT) of stool. / Every year beginning at age 21 years and continuing until age 5 years. You may not need to do this test if you get  a colonoscopy every 10 years.  Flexible sigmoidoscopy or colonoscopy.** / Every 5 years for a flexible sigmoidoscopy or every 10 years for a colonoscopy beginning at age 35 years and continuing until age 48 years.  Hepatitis C blood test.** / For all people born from [birth year] through 1965 and any individual with known risks for hepatitis C.  Skin self-exam. / Monthly.  Influenza vaccine. / Every year.  Tetanus, diphtheria, and acellular pertussis (Tdap/Td) vaccine.** / Consult your health care provider. Pregnant women should receive 1 dose of Tdap vaccine during each pregnancy. 1 dose of Td every 10 years.  Varicella vaccine.** / Consult your health care provider. Pregnant females who do not have evidence of immunity should receive the first dose after pregnancy.  Zoster vaccine.** / 1 dose for adults aged 30 years or older.  Measles, mumps, rubella (MMR) vaccine.** / You need at least 1 dose of MMR if you were born in 1957 or later. You may also need a second dose. For females of childbearing age, rubella immunity should be determined. If there is no evidence of immunity, females who are not pregnant should be vaccinated. If there is no evidence of immunity, females who are pregnant should delay immunization until after pregnancy.  Pneumococcal 13-valent conjugate (PCV13) vaccine.** / Consult your health care provider.  Pneumococcal polysaccharide (PPSV23) vaccine.** / 1 to 2 doses if you smoke cigarettes or if you have certain conditions.  Meningococcal vaccine.** /  Consult your health care provider.  Hepatitis A vaccine.** / Consult your health care provider.  Hepatitis B vaccine.** / Consult your health care provider.  Haemophilus influenzae type b (Hib) vaccine.** / Consult your health care provider. Ages 64 years and over  Blood pressure check.** / Every year.  Lipid and cholesterol check.** / Every 5 years beginning at age 23 years.  Lung cancer screening. / Every year if you  are aged 16-80 years and have a 30-pack-year history of smoking and currently smoke or have quit within the past 15 years. Yearly screening is stopped once you have quit smoking for at least 15 years or develop a health problem that would prevent you from having lung cancer treatment.  Clinical breast exam.** / Every year after age 74 years.  BRCA-related cancer risk assessment.** / For women who have family members with a BRCA-related cancer (breast, ovarian, tubal, or peritoneal cancers).  Mammogram.** / Every year beginning at age 44 years and continuing for as long as you are in good health. Consult with your health care provider.  Pap test.** / Every 3 years starting at age 58 years through age 22 or 39 years with 3 consecutive normal Pap tests. Testing can be stopped between 65 and 70 years with 3 consecutive normal Pap tests and no abnormal Pap or HPV tests in the past 10 years.  HPV screening.** / Every 3 years from ages 64 years through ages 70 or 61 years with a history of 3 consecutive normal Pap tests. Testing can be stopped between 65 and 70 years with 3 consecutive normal Pap tests and no abnormal Pap or HPV tests in the past 10 years.  Fecal occult blood test (FOBT) of stool. / Every year beginning at age 40 years and continuing until age 27 years. You may not need to do this test if you get a colonoscopy every 10 years.  Flexible sigmoidoscopy or colonoscopy.** / Every 5 years for a flexible sigmoidoscopy or every 10 years for a colonoscopy beginning at age 7 years and continuing until age 32 years.  Hepatitis C blood test.** / For all people born from [birth year] through 1963 and any individual with known risks for hepatitis C.  Osteoporosis screening.** / A one-time screening for women ages 30 years and over and women at risk for fractures or osteoporosis.  Skin self-exam. / Monthly.  Influenza vaccine. / Every year.  Tetanus, diphtheria, and acellular pertussis (Tdap/Td)  vaccine.** / 1 dose of Td every 10 years.  Varicella vaccine.** / Consult your health care provider.  Zoster vaccine.** / 1 dose for adults aged 35 years or older.  Pneumococcal 13-valent conjugate (PCV13) vaccine.** / Consult your health care provider.  Pneumococcal polysaccharide (PPSV23) vaccine.** / 1 dose for all adults aged 46 years and older.  Meningococcal vaccine.** / Consult your health care provider.  Hepatitis A vaccine.** / Consult your health care provider.  Hepatitis B vaccine.** / Consult your health care provider.  Haemophilus influenzae type b (Hib) vaccine.** / Consult your health care provider. ** Family history and personal history of risk and conditions may change your health care provider's recommendations.   This information is not intended to replace advice given to you by your health care provider. Make sure you discuss any questions you have with your health care provider.   Document Released: 11/14/2001 Document Revised: 10/09/2014 Document Reviewed: 02/13/2011 Elsevier Interactive Patient Education [employer].

## 2015-12-20 NOTE — Progress Notes (Signed)
Pre visit review using our clinic review tool, if applicable. No additional management support is needed unless otherwise documented below in the visit note. 

## 2015-12-20 NOTE — Progress Notes (Signed)
Subjective:     Latoya Lawson is a 50 y.o. female and is here for a comprehensive physical exam. The patient reports problems - irregular periods.  She is also c/o high bp and headaches --- she took losartan bid for 3 days.  Social History   Social History  . Marital Status: Married    Spouse Name: N/A  . Number of Children: N/A  . Years of Education: N/A   Occupational History  . Not on file.   Social History Main Topics  . Smoking status: Never Smoker   . Smokeless tobacco: Never Used  . Alcohol Use: Yes     Comment: occ  . Drug Use: No  . Sexual Activity:    Partners: Male    Birth Control/ Protection: Condom   Other Topics Concern  . Not on file   Social History Narrative   Health Maintenance  Topic Date Due  . HIV Screening  11/25/1982  . INFLUENZA VACCINE  12/19/2016 (Originally 05/03/2015)  . PAP SMEAR  12/15/2016  . TETANUS/TDAP  10/02/2018    The following portions of the patient's history were reviewed and updated as appropriate:  She  has a past medical history of Hypertension; Anxiety; Abnormal uterine bleeding; and Wears glasses. She  does not have any pertinent problems on file. She  has past surgical history that includes Breast enhancement surgery (2088); Nasal sinus surgery; and Mass excision (Left, 02/10/2014). Her family history includes Diabetes in her father; Hypertension in her brother, father, maternal grandfather, maternal grandmother, mother, paternal grandfather, paternal grandmother, and sister; Stroke in her father. She  reports that she has never smoked. She has never used smokeless tobacco. She reports that she drinks alcohol. She reports that she does not use illicit drugs. She has a current medication list which includes the following prescription(s): biotin, omega-3 fatty acids, and losartan. Current Outpatient Prescriptions on File Prior to Visit  Medication Sig Dispense Refill  . BIOTIN PO Take by mouth daily.    . Omega-3 Fatty Acids  (FISH OIL PO) Take by mouth daily.     No current facility-administered medications on file prior to visit.   She is allergic to ace inhibitors..  Review of Systems Review of Systems  Constitutional: Negative for activity change, appetite change and fatigue.  HENT: Negative for hearing loss, congestion, tinnitus and ear discharge.  dentist q35m Eyes: Negative for visual disturbance (see optho q1y -- vision corrected to 20/20 with glasses).  Respiratory: Negative for cough, chest tightness and shortness of breath.   Cardiovascular: Negative for chest pain, palpitations and leg swelling.  Gastrointestinal: Negative for abdominal pain, diarrhea, constipation and abdominal distention.  Genitourinary: Negative for urgency, frequency, decreased urine volume and difficulty urinating.  Musculoskeletal: Negative for back pain, arthralgias and gait problem.  Skin: Negative for color change, pallor and rash.  Neurological: Negative for dizziness, light-headedness, numbness--- headaches with inc bp 1 day  Hematological: Negative for adenopathy. Does not bruise/bleed easily.  Psychiatric/Behavioral: Negative for suicidal ideas, confusion, sleep disturbance, self-injury, dysphoric mood, decreased concentration and agitation.       Objective:    BP 146/84 mmHg  Pulse 72  Temp(Src) 97.8 F (36.6 C) (Oral)  Ht 5\' 2"  (1.575 m)  Wt 101 lb 6.4 oz (45.995 kg)  BMI 18.54 kg/m2  SpO2 100%  LMP 11/09/2015 General appearance: alert, cooperative, appears stated age and no distress Head: Normocephalic, without obvious abnormality, atraumatic Eyes: conjunctivae/corneas clear. PERRL, EOM's intact. Fundi benign. Ears: normal TM's and  external ear canals both ears Nose: Nares normal. Septum midline. Mucosa normal. No drainage or sinus tenderness. Throat: lips, mucosa, and tongue normal; teeth and gums normal Neck: no adenopathy, no carotid bruit, no JVD, supple, symmetrical, trachea midline and thyroid not  enlarged, symmetric, no tenderness/mass/nodules Back: symmetric, no curvature. ROM normal. No CVA tenderness. Lungs: clear to auscultation bilaterally Breasts: implants b/l Heart: regular rate and rhythm, S1, S2 normal, no murmur, click, rub or gallop Abdomen: soft, non-tender; bowel sounds normal; no masses,  no organomegaly Pelvic: deferred Extremities: extremities normal, atraumatic, no cyanosis or edema Pulses: 2+ and symmetric Skin: Skin color, texture, turgor normal. No rashes or lesions Lymph nodes: Cervical, supraclavicular, and axillary nodes normal. Neurologic: Alert and oriented X 3, normal strength and tone. Normal symmetric reflexes. Normal coordination and gait Psych- no depression, no anxiety      Assessment:    Healthy female exam.      Plan:    ghm utd Check labs See After Visit Summary for Counseling Recommendations   1. Irregular periods ? perimenopause - Ambulatory referral to Gynecology  2. Preventative health care See above - Comp Met (CMET) - CBC with Differential/Platelet - Lipid panel - POCT urinalysis dipstick - TSH  3. Essential hypertension Elevated --- inc cozaar to 100 mg  rto 2-3 weeks - losartan (COZAAR) 100 MG tablet; Take 1 tablet (100 mg total) by mouth daily.  Dispense: 90 tablet; Refill: 3  4. Encounter for screening for HIV   - HIV antibody

## 2015-12-27 ENCOUNTER — Ambulatory Visit (INDEPENDENT_AMBULATORY_CARE_PROVIDER_SITE_OTHER): Payer: BLUE CROSS/BLUE SHIELD | Admitting: Family Medicine

## 2015-12-27 ENCOUNTER — Encounter: Payer: Self-pay | Admitting: Family Medicine

## 2015-12-27 VITALS — BP 164/92 | HR 74 | Temp 98.8°F | Wt 101.8 lb

## 2015-12-27 DIAGNOSIS — I10 Essential (primary) hypertension: Secondary | ICD-10-CM | POA: Diagnosis not present

## 2015-12-27 DIAGNOSIS — R519 Headache, unspecified: Secondary | ICD-10-CM

## 2015-12-27 DIAGNOSIS — R51 Headache: Secondary | ICD-10-CM | POA: Diagnosis not present

## 2015-12-27 MED ORDER — HYDROCHLOROTHIAZIDE 25 MG PO TABS
25.0000 mg | ORAL_TABLET | Freq: Every day | ORAL | Status: DC
Start: 2015-12-27 — End: 2016-01-10

## 2015-12-27 NOTE — Patient Instructions (Signed)
Hypertension Hypertension, commonly called high blood pressure, is when the force of blood pumping through your arteries is too strong. Your arteries are the blood vessels that carry blood from your heart throughout your body. A blood pressure reading consists of a higher number over a lower number, such as 110/72. The higher number (systolic) is the pressure inside your arteries when your heart pumps. The lower number (diastolic) is the pressure inside your arteries when your heart relaxes. Ideally you want your blood pressure below 120/80. Hypertension forces your heart to work harder to pump blood. Your arteries may become narrow or stiff. Having untreated or uncontrolled hypertension can cause heart attack, stroke, kidney disease, and other problems. RISK FACTORS Some risk factors for high blood pressure are controllable. Others are not.  Risk factors you cannot control include:   Race. You may be at higher risk if you are African American.  Age. Risk increases with age.  Gender. Men are at higher risk than women before age 45 years. After age 65, women are at higher risk than men. Risk factors you can control include:  Not getting enough exercise or physical activity.  Being overweight.  Getting too much fat, sugar, calories, or salt in your diet.  Drinking too much alcohol. SIGNS AND SYMPTOMS Hypertension does not usually cause signs or symptoms. Extremely high blood pressure (hypertensive crisis) may cause headache, anxiety, shortness of breath, and nosebleed. DIAGNOSIS To check if you have hypertension, your health care provider will measure your blood pressure while you are seated, with your arm held at the level of your heart. It should be measured at least twice using the same arm. Certain conditions can cause a difference in blood pressure between your right and left arms. A blood pressure reading that is higher than normal on one occasion does not mean that you need treatment. If  it is not clear whether you have high blood pressure, you may be asked to return on a different day to have your blood pressure checked again. Or, you may be asked to monitor your blood pressure at home for 1 or more weeks. TREATMENT Treating high blood pressure includes making lifestyle changes and possibly taking medicine. Living a healthy lifestyle can help lower high blood pressure. You may need to change some of your habits. Lifestyle changes may include:  Following the DASH diet. This diet is high in fruits, vegetables, and whole grains. It is low in salt, red meat, and added sugars.  Keep your sodium intake below 2,300 mg per day.  Getting at least 30-45 minutes of aerobic exercise at least 4 times per week.  Losing weight if necessary.  Not smoking.  Limiting alcoholic beverages.  Learning ways to reduce stress. Your health care provider may prescribe medicine if lifestyle changes are not enough to get your blood pressure under control, and if one of the following is true:  You are 18-59 years of age and your systolic blood pressure is above 140.  You are 60 years of age or older, and your systolic blood pressure is above 150.  Your diastolic blood pressure is above 90.  You have diabetes, and your systolic blood pressure is over 140 or your diastolic blood pressure is over 90.  You have kidney disease and your blood pressure is above 140/90.  You have heart disease and your blood pressure is above 140/90. Your personal target blood pressure may vary depending on your medical conditions, your age, and other factors. HOME CARE INSTRUCTIONS    Have your blood pressure rechecked as directed by your health care provider.   Take medicines only as directed by your health care provider. Follow the directions carefully. Blood pressure medicines must be taken as prescribed. The medicine does not work as well when you skip doses. Skipping doses also puts you at risk for  problems.  Do not smoke.   Monitor your blood pressure at home as directed by your health care provider. SEEK MEDICAL CARE IF:   You think you are having a reaction to medicines taken.  You have recurrent headaches or feel dizzy.  You have swelling in your ankles.  You have trouble with your vision. SEEK IMMEDIATE MEDICAL CARE IF:  You develop a severe headache or confusion.  You have unusual weakness, numbness, or feel faint.  You have severe chest or abdominal pain.  You vomit repeatedly.  You have trouble breathing. MAKE SURE YOU:   Understand these instructions.  Will watch your condition.  Will get help right away if you are not doing well or get worse.   This information is not intended to replace advice given to you by your health care provider. Make sure you discuss any questions you have with your health care provider.   Document Released: 09/18/2005 Document Revised: 02/02/2015 Document Reviewed: 07/11/2013 Elsevier Interactive Patient Education 2016 Elsevier Inc.  

## 2015-12-27 NOTE — Progress Notes (Signed)
Pre visit review using our clinic review tool, if applicable. No additional management support is needed unless otherwise documented below in the visit note. 

## 2015-12-27 NOTE — Assessment & Plan Note (Signed)
con't losartan and add hctz Call us in 1 week if bp no better and we will adust med--- or change rto 2 weeks

## 2015-12-27 NOTE — Progress Notes (Signed)
Patient ID: Latoya Lawson, female    DOB: May 05, 1966  Age: 50 y.o. MRN: [id number]    Subjective:  Subjective HPI Latoya Lawson presents for bp f/u -- she is still getting headaches for 1 year now and pt is concerned and is wondering if more should be done.  Pressure pain in top of head.  No visual changes. No photophobia --- tylenol does help but it keeps coming back..      Review of Systems  Constitutional: Negative for diaphoresis, appetite change, fatigue and unexpected weight change.  Eyes: Negative for photophobia, pain, redness and visual disturbance.  Respiratory: Negative for cough, chest tightness, shortness of breath and wheezing.   Cardiovascular: Negative for chest pain, palpitations and leg swelling.  Endocrine: Negative for cold intolerance, heat intolerance, polydipsia, polyphagia and polyuria.  Genitourinary: Negative for dysuria, frequency and difficulty urinating.  Neurological: Positive for headaches. Negative for dizziness, light-headedness and numbness.    History Past Medical History  Diagnosis Date  . Hypertension   . Anxiety   . Abnormal uterine bleeding     after depo  . Wears glasses     She has past surgical history that includes Breast enhancement surgery (2088); Nasal sinus surgery; and Mass excision (Left, 02/10/2014).   Her family history includes Diabetes in her father; Hypertension in her brother, father, maternal grandfather, maternal grandmother, mother, paternal grandfather, paternal grandmother, and sister; Stroke in her father.She reports that she has never smoked. She has never used smokeless tobacco. She reports that she drinks alcohol. She reports that she does not use illicit drugs.  Current Outpatient Prescriptions on File Prior to Visit  Medication Sig Dispense Refill  . BIOTIN PO Take by mouth daily.    Marland Kitchen losartan (COZAAR) 100 MG tablet Take 1 tablet (100 mg total) by mouth daily. 90 tablet 3  . Omega-3 Fatty Acids (FISH OIL PO) Take by mouth  daily.     No current facility-administered medications on file prior to visit.     Objective:  Objective Physical Exam  Constitutional: She is oriented to person, place, and time. She appears well-developed and well-nourished.  HENT:  Head: Normocephalic and atraumatic.  Eyes: Conjunctivae and EOM are normal.  Neck: Normal range of motion. Neck supple. No JVD present. Carotid bruit is not present. No thyromegaly present.  Cardiovascular: Normal rate, regular rhythm and normal heart sounds.   No murmur heard. Pulmonary/Chest: Effort normal and breath sounds normal. No respiratory distress. She has no wheezes. She has no rales. She exhibits no tenderness.  Musculoskeletal: She exhibits no edema.  Neurological: She is alert and oriented to person, place, and time.  Psychiatric: She has a normal mood and affect. Her behavior is normal.  Nursing note and vitals reviewed.  BP 164/92 mmHg  Pulse 74  Temp(Src) 98.8 F (37.1 C) (Oral)  Wt 101 lb 12.8 oz (46.176 kg)  SpO2 99%  LMP 11/09/2015 Wt Readings from Last 3 Encounters:  12/27/15 101 lb 12.8 oz (46.176 kg)  12/20/15 101 lb 6.4 oz (45.995 kg)  06/21/15 99 lb 6.4 oz (45.088 kg)     Lab Results  Component Value Date   WBC 5.8 12/20/2015   HGB 12.8 12/20/2015   HCT 39.3 12/20/2015   PLT 216.0 12/20/2015   GLUCOSE 104* 12/20/2015   CHOL 181 12/20/2015   TRIG 87.0 12/20/2015   HDL 72.20 12/20/2015   LDLCALC 92 12/20/2015   ALT 14 12/20/2015   AST 18 12/20/2015   NA 139  12/20/2015   K 4.1 12/20/2015   CL 101 12/20/2015   CREATININE 0.77 12/20/2015   BUN 16 12/20/2015   CO2 31 12/20/2015   TSH 1.29 12/20/2015    No results found.   Assessment & Plan:  Plan I am having Ms. Forgey start on hydrochlorothiazide. I am also having her maintain her Omega-3 Fatty Acids (FISH OIL PO), BIOTIN PO, and losartan.  Meds ordered this encounter  Medications  . hydrochlorothiazide (HYDRODIURIL) 25 MG tablet    Sig: Take 1 tablet  (25 mg total) by mouth daily.    Dispense:  30 tablet    Refill:  0    Problem List Items Addressed This Visit      Unprioritized   HTN (hypertension) - Primary    con't losartan and add hctz Call us in 1 week if bp no better and we will adust med--- or change rto 2 weeks      Relevant Medications   hydrochlorothiazide (HYDRODIURIL) 25 MG tablet    Other Visit Diagnoses    Nonintractable headache, unspecified chronicity pattern, unspecified headache type        Relevant Orders    MR Brain Wo Contrast       Follow-up: Return in about 2 weeks (around 01/10/2016) for hypertension.  Garnet Koyanagi, DO

## 2016-01-01 ENCOUNTER — Ambulatory Visit (HOSPITAL_BASED_OUTPATIENT_CLINIC_OR_DEPARTMENT_OTHER): Payer: BLUE CROSS/BLUE SHIELD

## 2016-01-05 ENCOUNTER — Telehealth: Payer: Self-pay | Admitting: Family Medicine

## 2016-01-05 MED ORDER — AMLODIPINE BESYLATE 5 MG PO TABS: 5.0000 mg | ORAL_TABLET | Freq: Every day | ORAL | Status: DC

## 2016-01-05 NOTE — Telephone Encounter (Signed)
Notified pt's son and he voices understanding. 

## 2016-01-05 NOTE — Telephone Encounter (Signed)
Melissa-- please advise in Dr Nonda Lou absence?

## 2016-01-05 NOTE — Telephone Encounter (Signed)
Caller name: Hieu Relationship to patient: son Can be reached: [phone]  Reason for call: BP readings continue to be 160/80 - 160/95. Best 136/86 in the past week.

## 2016-01-05 NOTE — Telephone Encounter (Signed)
Add amlodipine 5mg  once daily. Follow up on 4/10 as scheduled.

## 2016-01-10 ENCOUNTER — Ambulatory Visit (INDEPENDENT_AMBULATORY_CARE_PROVIDER_SITE_OTHER): Payer: BLUE CROSS/BLUE SHIELD | Admitting: Family Medicine

## 2016-01-10 ENCOUNTER — Ambulatory Visit (HOSPITAL_COMMUNITY)
Admission: RE | Admit: 2016-01-10 | Discharge: 2016-01-10 | Disposition: A | Discharge status: Home / Self Care | Payer: BLUE CROSS/BLUE SHIELD | Source: Ambulatory Visit | Attending: Family Medicine | Admitting: Family Medicine

## 2016-01-10 ENCOUNTER — Encounter: Payer: Self-pay | Admitting: Family Medicine

## 2016-01-10 VITALS — BP 134/84 | HR 69 | Temp 98.1°F | Ht 62.0 in | Wt 102.0 lb

## 2016-01-10 DIAGNOSIS — I10 Essential (primary) hypertension: Secondary | ICD-10-CM | POA: Diagnosis not present

## 2016-01-10 DIAGNOSIS — M19042 Primary osteoarthritis, left hand: Secondary | ICD-10-CM | POA: Diagnosis not present

## 2016-01-10 DIAGNOSIS — R51 Headache: Secondary | ICD-10-CM | POA: Insufficient documentation

## 2016-01-10 DIAGNOSIS — S5401XA Injury of ulnar nerve at forearm level, right arm, initial encounter: Secondary | ICD-10-CM | POA: Diagnosis not present

## 2016-01-10 DIAGNOSIS — M19041 Primary osteoarthritis, right hand: Secondary | ICD-10-CM

## 2016-01-10 DIAGNOSIS — R519 Headache, unspecified: Secondary | ICD-10-CM

## 2016-01-10 MED ORDER — LOSARTAN POTASSIUM 100 MG PO TABS: 100.0000 mg | ORAL_TABLET | Freq: Every day | ORAL | Status: DC

## 2016-01-10 MED ORDER — HYDROCHLOROTHIAZIDE 25 MG PO TABS: 25.0000 mg | ORAL_TABLET | Freq: Every day | ORAL | Status: DC

## 2016-01-10 MED ORDER — MELOXICAM 7.5 MG PO TABS: 7.5000 mg | ORAL_TABLET | Freq: Every day | ORAL | Status: DC

## 2016-01-10 NOTE — Patient Instructions (Signed)
Hypertension Hypertension, commonly called high blood pressure, is when the force of blood pumping through your arteries is too strong. Your arteries are the blood vessels that carry blood from your heart throughout your body. A blood pressure reading consists of a higher number over a lower number, such as 110/72. The higher number (systolic) is the pressure inside your arteries when your heart pumps. The lower number (diastolic) is the pressure inside your arteries when your heart relaxes. Ideally you want your blood pressure below 120/80. Hypertension forces your heart to work harder to pump blood. Your arteries may become narrow or stiff. Having untreated or uncontrolled hypertension can cause heart attack, stroke, kidney disease, and other problems. RISK FACTORS Some risk factors for high blood pressure are controllable. Others are not.  Risk factors you cannot control include:   Race. You may be at higher risk if you are African American.  Age. Risk increases with age.  Gender. Men are at higher risk than women before age 45 years. After age 65, women are at higher risk than men. Risk factors you can control include:  Not getting enough exercise or physical activity.  Being overweight.  Getting too much fat, sugar, calories, or salt in your diet.  Drinking too much alcohol. SIGNS AND SYMPTOMS Hypertension does not usually cause signs or symptoms. Extremely high blood pressure (hypertensive crisis) may cause headache, anxiety, shortness of breath, and nosebleed. DIAGNOSIS To check if you have hypertension, your health care provider will measure your blood pressure while you are seated, with your arm held at the level of your heart. It should be measured at least twice using the same arm. Certain conditions can cause a difference in blood pressure between your right and left arms. A blood pressure reading that is higher than normal on one occasion does not mean that you need treatment. If  it is not clear whether you have high blood pressure, you may be asked to return on a different day to have your blood pressure checked again. Or, you may be asked to monitor your blood pressure at home for 1 or more weeks. TREATMENT Treating high blood pressure includes making lifestyle changes and possibly taking medicine. Living a healthy lifestyle can help lower high blood pressure. You may need to change some of your habits. Lifestyle changes may include:  Following the DASH diet. This diet is high in fruits, vegetables, and whole grains. It is low in salt, red meat, and added sugars.  Keep your sodium intake below 2,300 mg per day.  Getting at least 30-45 minutes of aerobic exercise at least 4 times per week.  Losing weight if necessary.  Not smoking.  Limiting alcoholic beverages.  Learning ways to reduce stress. Your health care provider may prescribe medicine if lifestyle changes are not enough to get your blood pressure under control, and if one of the following is true:  You are 18-59 years of age and your systolic blood pressure is above 140.  You are 60 years of age or older, and your systolic blood pressure is above 150.  Your diastolic blood pressure is above 90.  You have diabetes, and your systolic blood pressure is over 140 or your diastolic blood pressure is over 90.  You have kidney disease and your blood pressure is above 140/90.  You have heart disease and your blood pressure is above 140/90. Your personal target blood pressure may vary depending on your medical conditions, your age, and other factors. HOME CARE INSTRUCTIONS    Have your blood pressure rechecked as directed by your health care provider.   Take medicines only as directed by your health care provider. Follow the directions carefully. Blood pressure medicines must be taken as prescribed. The medicine does not work as well when you skip doses. Skipping doses also puts you at risk for  problems.  Do not smoke.   Monitor your blood pressure at home as directed by your health care provider. SEEK MEDICAL CARE IF:   You think you are having a reaction to medicines taken.  You have recurrent headaches or feel dizzy.  You have swelling in your ankles.  You have trouble with your vision. SEEK IMMEDIATE MEDICAL CARE IF:  You develop a severe headache or confusion.  You have unusual weakness, numbness, or feel faint.  You have severe chest or abdominal pain.  You vomit repeatedly.  You have trouble breathing. MAKE SURE YOU:   Understand these instructions.  Will watch your condition.  Will get help right away if you are not doing well or get worse.   This information is not intended to replace advice given to you by your health care provider. Make sure you discuss any questions you have with your health care provider.   Document Released: 09/18/2005 Document Revised: 02/02/2015 Document Reviewed: 07/11/2013 Elsevier Interactive Patient Education 2016 Elsevier Inc.  

## 2016-01-10 NOTE — Progress Notes (Signed)
Patient ID: Latoya Lawson, female    DOB: 07/18/1966  Age: 50 y.o. MRN: [id number]    Subjective:  Subjective HPI  [place] presents for f/u htn and c/o numbness and tingling in R hand and arm.  Numbness in 2-5 fingers and forearm.  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  Eyes: Negative for pain, redness and visual disturbance.  Respiratory: Negative for cough, chest tightness, shortness of breath and wheezing.   Cardiovascular: Negative for chest pain, palpitations and leg swelling.  Endocrine: Negative for cold intolerance, heat intolerance, polydipsia, polyphagia and polyuria.  Genitourinary: Negative for dysuria, frequency and difficulty urinating.  Neurological: Positive for weakness and numbness. Negative for dizziness, light-headedness and headaches.    History Past Medical History  Diagnosis Date  . Hypertension   . Anxiety   . Abnormal uterine bleeding     after depo  . Wears glasses     She has past surgical history that includes Breast enhancement surgery (2088); Nasal sinus surgery; and Mass excision (Left, 02/10/2014).   Her family history includes Diabetes in her father; Hypertension in her brother, father, maternal grandfather, maternal grandmother, mother, paternal grandfather, paternal grandmother, and sister; Stroke in her father.She reports that she has never smoked. She has never used smokeless tobacco. She reports that she drinks alcohol. She reports that she does not use illicit drugs.  Current Outpatient Prescriptions on File Prior to Visit  Medication Sig Dispense Refill  . amLODipine (NORVASC) 5 MG tablet Take 1 tablet (5 mg total) by mouth daily. 30 tablet 3  . BIOTIN PO Take by mouth daily.    . Omega-3 Fatty Acids (FISH OIL PO) Take by mouth daily.     No current facility-administered medications on file prior to visit.       Objective:  Objective Physical Exam  Constitutional: She is oriented to person, place, and time. She appears well-developed and well-nourished.  HENT:  Head: Normocephalic and atraumatic.  Eyes: Conjunctivae and EOM are normal.  Neck: Normal range of motion. Neck supple. No JVD present. Carotid bruit is not present. No thyromegaly present.  Cardiovascular: Normal rate, regular rhythm and normal heart sounds.   No murmur heard. Pulmonary/Chest: Effort normal and breath sounds normal. No respiratory distress. She has no wheezes. She has no rales. She exhibits no tenderness.  Musculoskeletal: She exhibits no edema.  Neurological: She is alert and oriented to person, place, and time. She has normal strength. A sensory deficit is present.  Psychiatric: She has a normal mood and affect.  Nursing note and vitals reviewed.  BP 134/84 mmHg  Pulse 69  Temp(Src) 98.1 F (36.7 C) (Oral)  Ht 5\' 2"  (1.575 m)  Wt 102 lb (46.267 kg)  BMI 18.65 kg/m2  SpO2 98%  LMP 11/09/2015 Wt Readings from Last 3 Encounters:  01/10/16 102 lb (46.267 kg)  12/27/15 101 lb 12.8 oz (46.176 kg)  12/20/15 101 lb 6.4 oz (45.995 kg)     Lab Results  Component Value Date   WBC 5.8 12/20/2015   HGB 12.8 12/20/2015   HCT 39.3 12/20/2015   PLT 216.0 12/20/2015   GLUCOSE 104* 12/20/2015   CHOL 181 12/20/2015   TRIG 87.0 12/20/2015   HDL 72.20 12/20/2015   LDLCALC 92 12/20/2015   ALT 14 12/20/2015   AST 18 12/20/2015   NA 139 12/20/2015   K 4.1  12/20/2015   CL 101 12/20/2015   CREATININE 0.77 12/20/2015   BUN 16 12/20/2015   CO2 31 12/20/2015   TSH 1.29 12/20/2015    Mr Brain Wo Contrast  01/10/2016  CLINICAL DATA:  Elevated blood pressure with headaches. EXAM: MRI HEAD WITHOUT CONTRAST TECHNIQUE: Multiplanar, multiecho pulse sequences of the brain and surrounding structures were obtained without intravenous contrast. COMPARISON:  Limited CT of the sinuses, 11/10/2013. FINDINGS: No evidence for acute  infarction, hemorrhage, mass lesion, hydrocephalus, or extra-axial fluid. Normal cerebral volume. No white matter disease. Pituitary, pineal, and cerebellar tonsils unremarkable. No upper cervical lesions. Flow voids are maintained throughout the carotid, basilar, and vertebral arteries. There are no areas of chronic hemorrhage. Visualized calvarium, skull base, and upper cervical osseous structures unremarkable. Scalp and extracranial soft tissues, orbits, sinuses, and mastoids show no acute process. Hypoplastic RIGHT maxillary sinus. IMPRESSION: Negative exam.  No acute intracranial findings. Electronically Signed   By: Staci Righter M.D.   On: 01/10/2016 18:26     Assessment & Plan:  Plan I am having Ms. Pensinger start on meloxicam. I am also having her maintain her Omega-3 Fatty Acids (FISH OIL PO), BIOTIN PO, amLODipine, hydrochlorothiazide, and losartan.  Meds ordered this encounter  Medications  . hydrochlorothiazide (HYDRODIURIL) 25 MG tablet    Sig: Take 1 tablet (25 mg total) by mouth daily.    Dispense:  30 tablet    Refill:  0  . meloxicam (MOBIC) 7.5 MG tablet    Sig: Take 1 tablet (7.5 mg total) by mouth daily.    Dispense:  30 tablet    Refill:  2  . losartan (COZAAR) 100 MG tablet    Sig: Take 1 tablet (100 mg total) by mouth daily.    Dispense:  90 tablet    Refill:  3    Problem List Items Addressed This Visit      Unprioritized   HTN (hypertension) - Primary   Relevant Medications   hydrochlorothiazide (HYDRODIURIL) 25 MG tablet   losartan (COZAAR) 100 MG tablet    Other Visit Diagnoses    Primary osteoarthritis of both hands        Relevant Medications    meloxicam (MOBIC) 7.5 MG tablet    Ulnar nerve damage, right, initial encounter        Relevant Orders    Ambulatory referral to Orthopedic Surgery       Follow-up: Return in about 3 months (around 04/10/2016), or if symptoms worsen or fail to improve, for hypertension.  Ann Held, DO

## 2016-01-20 ENCOUNTER — Ambulatory Visit (INDEPENDENT_AMBULATORY_CARE_PROVIDER_SITE_OTHER): Payer: BLUE CROSS/BLUE SHIELD | Admitting: Obstetrics & Gynecology

## 2016-01-20 ENCOUNTER — Encounter: Payer: Self-pay | Admitting: Obstetrics & Gynecology

## 2016-01-20 VITALS — BP 113/73 | HR 73 | Ht 62.0 in | Wt 101.0 lb

## 2016-01-20 DIAGNOSIS — N951 Menopausal and female climacteric states: Secondary | ICD-10-CM

## 2016-01-20 DIAGNOSIS — N912 Amenorrhea, unspecified: Secondary | ICD-10-CM

## 2016-01-20 LAB — POCT URINE PREGNANCY: Preg Test, Ur: NEGATIVE

## 2016-01-20 LAB — TSH: TSH: 0.75 mIU/L

## 2016-01-20 NOTE — Patient Instructions (Signed)
Perimenopause Perimenopause is the time when your body begins to move into the menopause (no menstrual period for 12 straight months). It is a natural process. Perimenopause can begin 2-8 years before the menopause and usually lasts for 1 year after the menopause. During this time, your ovaries may or may not produce an egg. The ovaries vary in their production of estrogen and progesterone hormones each month. This can cause irregular menstrual periods, difficulty getting pregnant, vaginal bleeding between periods, and uncomfortable symptoms. CAUSES  Irregular production of the ovarian hormones, estrogen and progesterone, and not ovulating every month.  Other causes include:  Tumor of the pituitary gland in the brain.  Medical disease that affects the ovaries.  Radiation treatment.  Chemotherapy.  Unknown causes.  Heavy smoking and excessive alcohol intake can bring on perimenopause sooner. SIGNS AND SYMPTOMS   Hot flashes.  Night sweats.  Irregular menstrual periods.  Decreased sex drive.  Vaginal dryness.  Headaches.  Mood swings.  Depression.  Memory problems.  Irritability.  Tiredness.  Weight gain.  Trouble getting pregnant.  The beginning of losing bone cells (osteoporosis).  The beginning of hardening of the arteries (atherosclerosis). DIAGNOSIS  Your health care provider will make a diagnosis by analyzing your age, menstrual history, and symptoms. He or she will do a physical exam and note any changes in your body, especially your female organs. Female hormone tests may or may not be helpful depending on the amount of female hormones you produce and when you produce them. However, other hormone tests may be helpful to rule out other problems. TREATMENT  In some cases, no treatment is needed. The decision on whether treatment is necessary during the perimenopause should be made by you and your health care provider based on how the symptoms are affecting you  and your lifestyle. Various treatments are available, such as:  Treating individual symptoms with a specific medicine for that symptom.  Herbal medicines that can help specific symptoms.  Counseling.  Group therapy. HOME CARE INSTRUCTIONS   Keep track of your menstrual periods (when they occur, how heavy they are, how long between periods, and how long they last) as well as your symptoms and when they started.  Only take over-the-counter or prescription medicines as directed by your health care provider.  Sleep and rest.  Exercise.  Eat a diet that contains calcium (good for your bones) and soy (acts like the estrogen hormone).  Do not smoke.  Avoid alcoholic beverages.  Take vitamin supplements as recommended by your health care provider. Taking vitamin E may help in certain cases.  Take calcium and vitamin D supplements to help prevent bone loss.  Group therapy is sometimes helpful.  Acupuncture may help in some cases. SEEK MEDICAL CARE IF:   You have questions about any symptoms you are having.  You need a referral to a specialist (gynecologist, psychiatrist, or psychologist). SEEK IMMEDIATE MEDICAL CARE IF:   You have vaginal bleeding.  Your period lasts longer than 8 days.  Your periods are recurring sooner than 21 days.  You have bleeding after intercourse.  You have severe depression.  You have pain when you urinate.  You have severe headaches.  You have vision problems.   This information is not intended to replace advice given to you by your health care provider. Make sure you discuss any questions you have with your health care provider.   Document Released: 10/26/2004 Document Revised: 10/09/2014 Document Reviewed: 04/17/2013 Elsevier Interactive Patient Education 2016 Elsevier Inc.    Th?i k? mn kinh Perimonopause l th?i ?i?m c? th? b?n b?t ??u di chuy?n vo th?i k? mn kinh (khng c kinh nguy?t trong 12 thng lin ti?p). ? l m?t qu trnh  t? nhin. Perimenopause c th? b?t ??u 2-8 n?m tr??c khi mn kinh v th??ng ko di 1 n?m sau khi mn kinh. Trong th?i gian ny, bu?ng tr?ng c?a b?n c th? [place]?c khng th? s?n xu?t tr?ng. Bu?ng tr?ng thay ??i trong vi?c s?n xu?t estrogen v hc mn progesterone m?i thng. ?i?u ny c th? gy ra kinh nguy?t b?t th??ng, kh c thai, ch?y mu m ??o gi?a cc giai ?o?n, v cc tri?u ch?ng khng tho?i mi. NGUYN NHN S?n xu?t khng ??u cc hc mn bu?ng tr?ng, estrogen v progesterone, v khng r?ng tr?ng m?i thng. Cc nguyn nhn khc bao g?m: Kh?i u tuy?n yn trong no. B?nh y t? ?nh h??ng ??n bu?ng tr?ng. X? l b?c x?. Ha tr?[place] Sledge nhn khng r. Ht thu?c l n?ng v u?ng qu nhi?u ch?t c?n c th? d?n ??n s?m h?n chu k? mn kinh. SIGNS V TRI?U CH?NG Nng b?ng. ?? m? hi ?m. Chu k? kinh nguy?t khng th??ng xuyn. Gi?m ham mu?n tnh d?c. Kh m ??o. Nh?c ??u. Tm tr?ng lng lng. Phi?n mu?n. Cc v?n ?? v? b? nh?. Cu g?t. M?t m?i. T?ng cn. R?c r?i khi mang [place]. S? kh?i ??u c?a vi?c m?t t? bo x??ng (long x??ng). S? kh?i ??u c?a vi?c lm c?ng cc ??ng m?ch (x? v?a ??ng m?ch). CH?N ?ON Nh cung c?p ch?m [place] s?c kho? c?a b?n s? ch?n ?on b?ng cch phn tch tu?i, l?ch s? kinh nguy?t v cc tri?u ch?ng. Anh ta s? khm s?c kho? v ghi nh?n b?t k? thay ??i no trong c? th?, ??c bi?t l cc c? quan c?a ph? n?. Cc xt nghi?m hoocmon n? c th? [place]?c khng th? h?u ch ty thu?c vo l??ng [place] n? b?n s?n xu?t v khi b?n s?n sinh chng. Tuy nhin, cc xt nghi?m hoc mn khc c th? h?u ch ?? lo?i tr? cc v?n ?? khc. ?I?U TR? Trong m?t s? tr??ng h?p, khng c?n ?i?u tr?. Quy?t ??nh v? vi?c ?i?u tr? li?u l c?n thi?t trong su?t k? mn kinh ph?i do b?n v nh cung c?p d?ch v? ch?m [place] s?c kho? th?c hi?n d?a trn nh?ng tri?u ch?ng ?nh h??ng ??n b?n v l?i s?ng c?a b?n nh? th? no. C nhi?u cch ?i?u tr? khc nhau, nh?: ?i?u tr? cc tri?u ch?ng c nhn b?ng m?t lo?i thu?c c? th? cho tri?u  ch?ng ?. Thu?c th?o d??c c th? gip cc tri?u ch?ng c? th?. T? v?n. Tr? li?u nhm. H??ng d?n ch?m [place] t?i nh Theo di chu k? kinh nguy?t c?a b?n (khi chng xu?t hi?n, chng n?ng bao nhiu, kho?ng th?i gian v th?i gian ko di bao lu) c?ng nh? cc tri?u ch?ng v khi no b?t ??u. Ch? dng thu?c mua t? do [place]?c thu?c theo toa theo ch? d?n c?a nh cung c?p d?ch v? ch?m [place] s?c kho? c?a b?n. Ng? v ngh? ng?i. T?p th? d?c. ?n m?t ch? ?? ?n king ch?a canxi (t?t cho x??ng c?a b?n) v ??u nnh (Nikolic?t ??ng nh? hoocmon estrogen). Khng ht thu?c. Trnh ?? u?ng c c?n. Dng ch?t b? sung vitamin theo khuy?n co c?a nh cung c?p d?ch v? ch?m [place] s?c kho? c?a b?n. Dng vitamin E c th? gip trong nh?ng tr??ng h?p nh?t ??nh. Dng b? sung canxi  v vitamin D ?? gip ng?n ng?a m?t x??ng. Li?u php nhm ?i khi h?u ch. Chm c?u c th? gip trong m?t s? tr??ng h?p. TM HI?U CH?M [place] Y T? N?U: B?n c th?c m?c v? b?t k? tri?u ch?ng no b?n ?ang g?p ph?i. B?n c?n ???c gi?i thi?u ??n m?t chuyn gia (bc s? ph? khoa, bc s? tm th?n, [place]?c nh tm l h?c). TM HI?U Y T? TR?C TI?P Y T? N?U: B?n b? ch?y mu m ??o. Th?i gian c?a b?n ko di h?n 8 ngy. K? kinh c?a b?n l?p l?i s?m h?n 21 ngy. B?n b? ch?y mu sau khi giao h?p. B?n c tr?m c?m nghim tr?ng. B?n b? ?au khi ?i ti?u. B?n b? ?au ??u nghim tr?ng. B?n c v?n ?? v? th? l?c.  Thng tin ny khng nh?m thay th? cc l?i khuyn do nh cung c?p d?ch v? ch?m [place] s?c kho? cung c?p. ??m b?o b?n th?o lu?n b?t k? cu h?i no b?n c v?i nh cung c?p d?ch v? ch?m [employer] s?c kho?.   Suggest an Corporate investment banker for [place]

## 2016-01-20 NOTE — Progress Notes (Addendum)
Patient ID: Latoya Lawson, female   DOB: Dec 26, 1965, 50 y.o.   MRN: [id number] History:  50 y.o. [id number] here today for eval of irreg cycles.  She reports a h/o 3 day cycles monthly.  She reports that in FEB she had bleeding from 6-18th but, since that time no menses. The bleeding in FEB was very light bleeding.  Pt reports that she is having hot flushes.  She reports 2-3 episodes at night.  It occurs in the daytime as well. She also reports new onset mood changes.  She feels more sad than usual.   The following portions of the patient's history were reviewed and updated as appropriate: allergies, current medications, past family history, past medical history, past social history, past surgical history and problem list.  Review of Systems:  Pertinent items are noted in HPI.  Objective:  Physical Exam Blood pressure 113/73, pulse 73, height 5\' 2"  (1.575 m), weight 101 lb (45.813 kg), last menstrual period 11/08/2015. Gen: NAD Lungs: CTA CV: RRR Abd: Soft, nontender and nondistended Pelvic: Normal appearing external genitalia; normal appearing vaginal mucosa and cervix.  Normal discharge.  Small uterus, no other palpable masses, no uterine or adnexal tenderness   Assessment & Plan:  AUB- suspect perimenopause.   Lab FSH and TSH No PAP indicated this year.  Next PAP due 11/2016 F/u in 6 months to reassess bleeding  Pt declines treatment at this time Info sheet given in [place] on Perimenopuase. Resource interpreter was present for translation  [place]. Harraway-Smith, M.D., Cherlynn June

## 2016-01-21 LAB — FOLLICLE STIMULATING HORMONE: FSH: 42.2 m[IU]/mL

## 2016-01-25 ENCOUNTER — Telehealth: Payer: Self-pay

## 2016-01-25 NOTE — Telephone Encounter (Signed)
Called patient with interpreter. Let her know her [place] level shows she is in menopause but she can be consider menopause until she has gone a whole year without a period. Interpreter states patient understands. Also let her know her TSH is normal.[employer] used. Kathrene Alu RN BSN

## 2016-01-25 NOTE — Telephone Encounter (Signed)
-----   Message from Lavonia Drafts, MD sent at 01/24/2016 11:38 AM EDT ----- Please call pt. Will need interpreter.    Her [place] is in the menopausal range.  Her TSH is normal.  She is NOT in menopause until she is 1 full year without a period.  Thx, clh-S

## 2016-02-17 ENCOUNTER — Ambulatory Visit: Payer: BLUE CROSS/BLUE SHIELD | Admitting: Certified Nurse Midwife

## 2016-03-01 ENCOUNTER — Telehealth: Payer: Self-pay | Admitting: Family Medicine

## 2016-03-01 DIAGNOSIS — I10 Essential (primary) hypertension: Secondary | ICD-10-CM

## 2016-03-01 MED ORDER — HYDROCHLOROTHIAZIDE 25 MG PO TABS: 25.0000 mg | ORAL_TABLET | Freq: Every day | ORAL | Status: DC

## 2016-03-01 NOTE — Telephone Encounter (Signed)
Rx faxed.    KP 

## 2016-03-01 NOTE — Telephone Encounter (Signed)
Can be reached: [phone] Pharmacy: [id number] - [place], [address]  Reason for call: Pt son called for refill on hydrochlorothiazide. Pt is out.

## 2016-03-01 NOTE — Telephone Encounter (Signed)
Notified pt son

## 2016-04-05 ENCOUNTER — Other Ambulatory Visit: Payer: Self-pay | Admitting: Family Medicine

## 2016-06-08 ENCOUNTER — Other Ambulatory Visit: Payer: Self-pay | Admitting: Family

## 2016-09-12 ENCOUNTER — Other Ambulatory Visit: Payer: Self-pay | Admitting: Family

## 2016-09-12 NOTE — Telephone Encounter (Signed)
2 week supply of amlodipine sent to pharmacy. Pt was due for follow up of BP in July and is past due. Please call pt to schedule appt before further refills are due.

## 2016-09-26 NOTE — Telephone Encounter (Signed)
Left message for patient to call office and schedule medication follow up prior to future refills

## 2016-10-09 ENCOUNTER — Ambulatory Visit: Payer: BLUE CROSS/BLUE SHIELD | Admitting: Medical

## 2016-10-16 ENCOUNTER — Ambulatory Visit (INDEPENDENT_AMBULATORY_CARE_PROVIDER_SITE_OTHER): Payer: BLUE CROSS/BLUE SHIELD | Admitting: Medical

## 2016-10-16 ENCOUNTER — Encounter: Payer: Self-pay | Admitting: Medical

## 2016-10-16 ENCOUNTER — Telehealth: Payer: Self-pay | Admitting: Family Medicine

## 2016-10-16 VITALS — BP 112/70 | HR 65 | Temp 97.7°F | Resp 16 | Ht 62.0 in | Wt 101.5 lb

## 2016-10-16 DIAGNOSIS — R509 Fever, unspecified: Secondary | ICD-10-CM | POA: Diagnosis not present

## 2016-10-16 DIAGNOSIS — N926 Irregular menstruation, unspecified: Secondary | ICD-10-CM | POA: Diagnosis not present

## 2016-10-16 DIAGNOSIS — R5383 Other fatigue: Secondary | ICD-10-CM | POA: Diagnosis not present

## 2016-10-16 DIAGNOSIS — G5601 Carpal tunnel syndrome, right upper limb: Secondary | ICD-10-CM | POA: Diagnosis not present

## 2016-10-16 LAB — CBC WITH DIFFERENTIAL/PLATELET
Basophils Absolute: 0 10*3/uL (ref 0.0–0.1)
Basophils Relative: 0.6 % (ref 0.0–3.0)
EOS ABS: 0 10*3/uL (ref 0.0–0.7)
Eosinophils Relative: 0.8 % (ref 0.0–5.0)
HCT: 39.6 % (ref 36.0–46.0)
HEMOGLOBIN: 13.4 g/dL (ref 12.0–15.0)
Lymphocytes Relative: 37 % (ref 12.0–46.0)
Lymphs Abs: 2.3 10*3/uL (ref 0.7–4.0)
MCHC: 33.8 g/dL (ref 30.0–36.0)
MCV: 84.7 fl (ref 78.0–100.0)
MONO ABS: 0.5 10*3/uL (ref 0.1–1.0)
Monocytes Relative: 8.6 % (ref 3.0–12.0)
Neutro Abs: 3.2 10*3/uL (ref 1.4–7.7)
Neutrophils Relative %: 53 % (ref 43.0–77.0)
Platelets: 224 10*3/uL (ref 150.0–400.0)
RBC: 4.67 Mil/uL (ref 3.87–5.11)
RDW: 12.7 % (ref 11.5–15.5)
WBC: 6.1 10*3/uL (ref 4.0–10.5)

## 2016-10-16 LAB — VITAMIN B12: Vitamin B-12: 994 pg/mL — ABNORMAL HIGH (ref 211–911)

## 2016-10-16 LAB — COMPREHENSIVE METABOLIC PANEL
ALBUMIN: 4.6 g/dL (ref 3.5–5.2)
ALT: 12 U/L (ref 0–35)
AST: 17 U/L (ref 0–37)
Alkaline Phosphatase: 45 U/L (ref 39–117)
BUN: 11 mg/dL (ref 6–23)
CHLORIDE: 100 meq/L (ref 96–112)
CO2: 30 mEq/L (ref 19–32)
CREATININE: 0.75 mg/dL (ref 0.40–1.20)
Calcium: 10.1 mg/dL (ref 8.4–10.5)
GFR: 87.33 mL/min (ref >60.00)
GLUCOSE: 111 mg/dL — AB (ref 70–99)
Potassium: 4.6 mEq/L (ref 3.5–5.1)
SODIUM: 135 meq/L (ref 135–145)
Total Bilirubin: 0.9 mg/dL (ref 0.2–1.2)
Total Protein: 8 g/dL (ref 6.0–8.3)

## 2016-10-16 LAB — T4, FREE: Free T4: 1 ng/dL (ref 0.60–1.60)

## 2016-10-16 LAB — VITAMIN D 25 HYDROXY (VIT D DEFICIENCY, FRACTURES): VITD: 20.7 ng/mL — ABNORMAL LOW (ref 30.00–100.00)

## 2016-10-16 LAB — FOLLICLE STIMULATING HORMONE: FSH: 7.2 m[IU]/mL

## 2016-10-16 LAB — TSH: TSH: 1.41 u[IU]/mL (ref 0.35–4.50)

## 2016-10-16 LAB — FOLATE: FOLATE: 17.5 ng/mL (ref >5.9)

## 2016-10-16 MED ORDER — DICLOFENAC SODIUM 75 MG PO TBEC: 75.0000 mg | DELAYED_RELEASE_TABLET | Freq: Two times a day (BID) | ORAL | 0 refills | Status: DC

## 2016-10-16 NOTE — Progress Notes (Signed)
Pre visit review using our clinic review tool, if applicable. No additional management support is needed unless otherwise documented below in the visit note/SLS  

## 2016-10-16 NOTE — Telephone Encounter (Signed)
Done

## 2016-10-16 NOTE — Progress Notes (Signed)
Subjective:    Patient ID: Latoya Lawson, female    DOB: Sep 01, 1966, 51 y.o.   MRN: [id number]  HPI  Pt in states has intermittent warm sensation over past 2-3  months. Occur couple of hours then goes. She has tired and fatigue with chills for those 2 hours. Pt takes tylenol and she thinks this helps.  Pt does not sweat. Pt states weigh loss 1-2 pounds and not sleeping well. Decreased appetite. No tachycardia, no polyuria, polydypsia or sweats. Pt states last year had no menses for 2 months. Pt was told last year perimenopausal.  Pt had some pain and swelling in her mouth one week ago. Went away one week ago. She states only drank water a week ago and symptoms resolved.   Pt blood pressure is on low side today. Has been higher in the past but last check was good. Pt feels fine. Pt by chart stopped amlodipine in the past due to low bp.     Review of Systems  Constitutional: Positive for chills, fatigue and fever.       Subjective intermittent.  HENT: Negative for congestion.   Respiratory: Negative for cough, chest tightness, shortness of breath and wheezing.   Cardiovascular: Negative for chest pain and palpitations.  Endocrine: Negative for polydipsia, polyphagia and polyuria.  Musculoskeletal:       Intermittent hand tingling and numbness in the am.  Neurological: Negative for dizziness, speech difficulty, weakness, light-headedness and numbness.  Hematological: Negative for adenopathy. Does not bruise/bleed easily.  Psychiatric/Behavioral: Positive for sleep disturbance. Negative for behavioral problems, confusion and decreased concentration.       Mild sleep disturbance.   Past Medical History:  Diagnosis Date  . Abnormal uterine bleeding    after depo  . Anxiety   . Hypertension   . Wears glasses      Social History   Social History  . Marital status: Married    Spouse name: N/A  . Number of children: N/A  . Years of education: N/A   Occupational History  . Not on  file.   Social History Main Topics  . Smoking status: Never Smoker  . Smokeless tobacco: Never Used  . Alcohol use Yes     Comment: occ  . Drug use: No  . Sexual activity: Yes    Partners: Male    Birth control/ protection: Condom   Other Topics Concern  . Not on file   Social History Narrative  . No narrative on file    Past Surgical History:  Procedure Laterality Date  . BREAST ENHANCEMENT SURGERY  2088  . MASS EXCISION Left 02/10/2014   Procedure: EXCISION LEFT BREAST MASS;  Surgeon: Harl Bowie, MD;  Location: [facility];  Service: General;  Laterality: Left;  . NASAL SINUS SURGERY      Family History  Problem Relation Age of Onset  . Hypertension Mother   . Hypertension Father   . Diabetes Father   . Stroke Father   . Hypertension Sister   . Hypertension Brother   . Hypertension Maternal Grandmother   . Hypertension Maternal Grandfather   . Hypertension Paternal Grandmother   . Hypertension Paternal Grandfather     Allergies  Allergen Reactions  . Ace Inhibitors Cough    Current Outpatient Prescriptions on File Prior to Visit  Medication Sig Dispense Refill  . hydrochlorothiazide (HYDRODIURIL) 25 MG tablet Take 1 tablet (25 mg total) by mouth daily. 30 tablet 5  . losartan (  COZAAR) 100 MG tablet Take 1 tablet (100 mg total) by mouth daily. 90 tablet 3  . amLODipine (NORVASC) 5 MG tablet TAKE 1 TABLET(5 MG) BY MOUTH DAILY (Patient not taking: Reported on 10/16/2016) 14 tablet 0   No current facility-administered medications on file prior to visit.     BP 105/67 (BP Location: Right Arm, Patient Position: Sitting, Cuff Size: Normal)   Pulse 65   Temp 97.7 F (36.5 C) (Oral)   Resp 16   Ht 5\' 2"  (1.575 m)   Wt 101 lb 8 oz (46 kg)   SpO2 100%   BMI 18.56 kg/m       Objective:   Physical Exam   General Mental Status- Alert. General Appearance- Not in acute distress.   Skin General: Color- Normal Color. Moisture- Normal  Moisture.  Neck Carotid Arteries- Normal color. Moisture- Normal Moisture. No carotid bruits. No JVD.  Chest and Lung Exam Auscultation: Breath Sounds:-Normal.  Cardiovascular Auscultation:Rythm- Regular. Murmurs & Other Heart Sounds:Auscultation of the heart reveals- No Murmurs.  Abdomen Inspection:-Inspeection Normal. Palpation/Percussion:Note:No mass. Palpation and Percussion of the abdomen reveal- Non Tender, Non Distended + BS, no rebound or guarding.   Neurologic Cranial Nerve exam:- CN III-XII intact(No nystagmus), symmetric smile. Drift Test:- No drift. Finger to Nose:- Normal/Intact Strength:- 5/5 equal and symmetric strength both upper and lower extremities.  Wrists- negative phalens sign today on exam.     Assessment & Plan:  For your fatigue,chills, and intermittent warm sensation, I ordered cbc, cmp, tsh, t4, vitamin D, b12, b-1 and folate.   For irregular menses will do fsh.  For elevated blood pressure continue the same meds but check her blood pressure daily. Write down your reading. Want to know if your  Systolic dropping less than 110.  Will rx diclofenac to use for hand tingling if she needs. But if hand worsens as before then will need to refer back to specialist.    Follow up in one month or as needed.  Time spent with pt was 40 minutes. Exam done through interpretor. 50% of time spent on counseling regarding varied differential diagnoss for her fatigue, fever and chills(as well as potential tx). Also time spent discussing potential perimenopause and plan regarding her likey CTS.

## 2016-10-16 NOTE — Telephone Encounter (Signed)
Relation to [id number] Call back [phone] Pharmacy: [facility], [place], [place] [address] [phone]   Reason for call:  Patient would like diclofenac (VOLTAREN) 75 MG EC tablet please send to [facility], [place], [place] [address] [phone]

## 2016-10-16 NOTE — Patient Instructions (Addendum)
For your fatigue,chills, and intermittent warm sensation, I ordered cbc, cmp, tsh, t4, vitamin D, b12, b-1 and folate.   For irregular menses will do fsh.  For elevated blood pressure continue the same meds but check her blood pressure daily. Write down your reading. Want to know if your  Systolic dropping less than 110.  Will rx diclofenac to use for hand tingling if she needs. But if hand worsens as before then will need to refer back to specialist.  Follow up in one month or as needed.

## 2016-10-17 ENCOUNTER — Telehealth: Payer: Self-pay | Admitting: Medical

## 2016-10-17 ENCOUNTER — Encounter: Payer: Self-pay | Admitting: Medical

## 2016-10-17 DIAGNOSIS — R7989 Other specified abnormal findings of blood chemistry: Secondary | ICD-10-CM

## 2016-10-17 MED ORDER — VITAMIN D (ERGOCALCIFEROL) 1.25 MG (50000 UNIT) PO CAPS: 50000.0000 [IU] | ORAL_CAPSULE | ORAL | 0 refills | Status: AC

## 2016-10-17 NOTE — Telephone Encounter (Signed)
rx vitamin d sent to her pharmacy. I will put future vitamin d level but do in 8 weeks.

## 2016-10-19 ENCOUNTER — Encounter: Payer: Self-pay | Admitting: Medical

## 2016-10-21 LAB — VITAMIN B1: VITAMIN B1 (THIAMINE): 13 nmol/L (ref 8–30)

## 2016-11-13 ENCOUNTER — Ambulatory Visit (INDEPENDENT_AMBULATORY_CARE_PROVIDER_SITE_OTHER): Payer: BLUE CROSS/BLUE SHIELD | Admitting: Family Medicine

## 2016-11-13 ENCOUNTER — Encounter: Payer: Self-pay | Admitting: Family Medicine

## 2016-11-13 VITALS — BP 120/66 | HR 66 | Temp 98.1°F | Resp 16 | Ht 62.0 in | Wt 103.2 lb

## 2016-11-13 DIAGNOSIS — E559 Vitamin D deficiency, unspecified: Secondary | ICD-10-CM

## 2016-11-13 DIAGNOSIS — I10 Essential (primary) hypertension: Secondary | ICD-10-CM

## 2016-11-13 DIAGNOSIS — R739 Hyperglycemia, unspecified: Secondary | ICD-10-CM | POA: Insufficient documentation

## 2016-11-13 DIAGNOSIS — R748 Abnormal levels of other serum enzymes: Secondary | ICD-10-CM | POA: Diagnosis not present

## 2016-11-13 DIAGNOSIS — R1013 Epigastric pain: Secondary | ICD-10-CM | POA: Diagnosis not present

## 2016-11-13 DIAGNOSIS — R7989 Other specified abnormal findings of blood chemistry: Secondary | ICD-10-CM

## 2016-11-13 LAB — COMPREHENSIVE METABOLIC PANEL
ALT: 10 U/L (ref 0–35)
AST: 14 U/L (ref 0–37)
Albumin: 4.5 g/dL (ref 3.5–5.2)
Alkaline Phosphatase: 40 U/L (ref 39–117)
BUN: 12 mg/dL (ref 6–23)
CHLORIDE: 104 meq/L (ref 96–112)
CO2: 30 meq/L (ref 19–32)
Calcium: 9.3 mg/dL (ref 8.4–10.5)
Creatinine, Ser: 0.68 mg/dL (ref 0.40–1.20)
GFR: 97.76 mL/min (ref >60.00)
GLUCOSE: 101 mg/dL — AB (ref 70–99)
POTASSIUM: 4.5 meq/L (ref 3.5–5.1)
SODIUM: 137 meq/L (ref 135–145)
TOTAL PROTEIN: 7.4 g/dL (ref 6.0–8.3)
Total Bilirubin: 0.7 mg/dL (ref 0.2–1.2)

## 2016-11-13 LAB — LIPID PANEL
CHOL/HDL RATIO: 2
Cholesterol: 173 mg/dL (ref 0–200)
HDL: 71.7 mg/dL (ref >39.00)
LDL CALC: 83 mg/dL (ref 0–99)
NONHDL: 101.53
Triglycerides: 93 mg/dL (ref 0.0–149.0)
VLDL: 18.6 mg/dL (ref 0.0–40.0)

## 2016-11-13 LAB — CBC WITH DIFFERENTIAL/PLATELET
BASOS ABS: 0 10*3/uL (ref 0.0–0.1)
Basophils Relative: 0.6 % (ref 0.0–3.0)
EOS ABS: 0 10*3/uL (ref 0.0–0.7)
Eosinophils Relative: 0.5 % (ref 0.0–5.0)
HEMATOCRIT: 36.2 % (ref 36.0–46.0)
Hemoglobin: 11.8 g/dL — ABNORMAL LOW (ref 12.0–15.0)
LYMPHS PCT: 38.6 % (ref 12.0–46.0)
Lymphs Abs: 1.7 10*3/uL (ref 0.7–4.0)
MCHC: 32.7 g/dL (ref 30.0–36.0)
MCV: 85.6 fl (ref 78.0–100.0)
MONOS PCT: 9.7 % (ref 3.0–12.0)
Monocytes Absolute: 0.4 10*3/uL (ref 0.1–1.0)
NEUTROS PCT: 50.6 % (ref 43.0–77.0)
Neutro Abs: 2.3 10*3/uL (ref 1.4–7.7)
PLATELETS: 213 10*3/uL (ref 150.0–400.0)
RBC: 4.23 Mil/uL (ref 3.87–5.11)
RDW: 13.7 % (ref 11.5–15.5)
WBC: 4.5 10*3/uL (ref 4.0–10.5)

## 2016-11-13 LAB — POCT URINALYSIS DIPSTICK
Bilirubin, UA: NEGATIVE
GLUCOSE UA: NEGATIVE
Ketones, UA: NEGATIVE
Leukocytes, UA: NEGATIVE
NITRITE UA: NEGATIVE
RBC UA: NEGATIVE
SPEC GRAV UA: 1.025
UROBILINOGEN UA: NEGATIVE
pH, UA: 6

## 2016-11-13 LAB — HEMOGLOBIN A1C: Hgb A1c MFr Bld: 5.7 % (ref 4.6–6.5)

## 2016-11-13 LAB — H. PYLORI ANTIBODY, IGG: H Pylori IgG: NEGATIVE

## 2016-11-13 LAB — VITAMIN B12: VITAMIN B 12: 715 pg/mL (ref 211–911)

## 2016-11-13 MED ORDER — OMEPRAZOLE 20 MG PO CPDR: 20.0000 mg | DELAYED_RELEASE_CAPSULE | Freq: Every day | ORAL | 3 refills | Status: AC

## 2016-11-13 NOTE — Patient Instructions (Signed)
? nng ([place]) ? nng l m?t lo?i ?au hay kh ch?u c th? x?y ra trong c? h?ng Chopra?c ng?c. N th??ng ???c m t? nh? m?t c?n ?au rt. N c?ng c th? gy ra m?t vi? ???ng trong mi?ng. ? nng c th? c?m th?y t?i t? h?n khi quy? vi? n?m xu?ng Feggins?c ci xu?ng, v n th??ng n?ng h?n vo ban ?m. ? nng c th? xa?y ra do ca?c th?? trong d? dy di chuy?n ng???c ln th?c qu?n (tra?o ng???c). H??NG D?N CH?M [place] T?I NH Th?c hi?n nh??ng hnh ??ng na?y ?? gi?m kh ch?u v gip trnh cc bi?n ch?ng. Ch? ?? ?n u?ng   Tun th? m?t ch? ?? ?n theo khuy?n ngh? c?a chuyn gia ch?m [place] s?c kh?e. Vi?c ny c th? l trnh cc th?c ?n v ?? u?ng nh?:  C ph v tr (c Fuentes?c khng c caffeine).  ?? u?ng c ch?a r??u.  ?? u?ng t?ng l?c v ?? u?ng dng trong th? thao.  ?? u?ng c ga Spicer?c soda.  S c la v c ca.  B?c h v h??ng v? b?c h.  T?i v hnh.  C?i ng?a (Horseradish).  Cc th?c ?n nhi?u gia v? v a xt, bao g?m h?t tiu, b?t ?t, b?t ca ri, gi?m, n??c s?t cay, v n??c s?t barbecue.  N??c qu? [place]o?c qu? h? cam qut, ch?ng h?n nh? cam, chanh v chanh l cam.  Cc th?c ?n c c chua, nh? n??c x?t ??, ?t, n??c x?t salsa, v pizza km x?t ??Marland Kitchen  Th?c ?n chin v nhi?u ch?t bo, ch?ng h?n nh? bnh rn, khoai ty chin, khoai ty rn v n??c x?t nhi?u ch?t bo.  Th?t nhi?u ch?t bo, ch?ng h?n nh? hot dog (bnh m k?p xc xch) v cc lo?i th?t ?? v tr?ng nhi?u m?, ch?ng h?n nh? th?t n?c l?ng, xc xch, gi?m bng v th?t l?n xng khi.  Nh?ng s?n ph?m b? s?a giu ch?t bo, nh? s?a nguyn kem, b? v pho mt kem.  ?n cc b?a nh?, th??ng xuyn thay v cc b?a no.  Trnh u?ng nhi?u n??c khi qu v? ?n.  Trnh ?n trong kho?ng 2-3 gi? tr??c khi ?i ng?.  Trnh n?m xu?ng ngay sau khi ?n.  Khng t?p th? d?c ngay sau khi ?n. H??ng d?n chung   Ch  ??n nh?ng thay ??i v? tri?u ch?ng c?a qu v?.  Ch? s? d?ng thu?c khng c?n k ??n v thu?c c?n k ??n theo ch? d?n c?a chuyn gia ch?m [place] s?c kh?e. Khng  dng aspirin, ibuprofen, Swopes?c cc thu?c NSAID khc tr? khi chuyn gia ch?m [place] s?c kh?e c?a qu v? ba?o quy? vi? du?ng.  Khng s? d?ng b?t k? s?n ph?m thu?c l no, bao g?m thu?c l d?ng ht, thu?c l d?ng nhai v thu?c l ?i?n t?. N?u qu v? c?n gip ?? ?? cai thu?c, hy h?i chuyn gia ch?m [place] s?c kh?e.  M?c qu?n o r?ng. Khng m?c ci g ch?t quanh eo m c th? t?o p l?c ln b?ng quy? vi?.  Nng cao (nng) ??u gi??ng c?a quy? vi? kho?ng 6 inch (15 cm).  C? g?ng gi?m c?ng th?ng, ch?ng h?n nh? t?p yoga Lottes?c thi?n. N?u qu v? c?n gip ?? ?? gi?m c?ng th?ng, hy h?i chuyn gia ch?m [place] s?c kh?e.  N?u qu v? th?a cn, hy gi?m cn n?ng v? m?c c l?i cho s?c kh?e c?a qu v?. Hy h?i chuyn gia ch?m [place] s?c  kh?e ?? ???c h??ng d?n v? m?c tiu gi?m cn an ton.  Tun th? t?t c? cc cu?c h?n khm l?i theo  ki?n c?a chuyn gia ch?m [place] s?c kh?e. ?i?u ny c vai tr quan tr?ng. ?I [facility] N?U:  Qu v? c cc tri?u ch?ng m?i.  Qu v? b? s?t cn khng r nguyn nhn.  Qu v? b? kh nu?t Mealy?c b? ?au khi nu?t.  Qu v? th? kh kh Neff?c Bialy dai d?ng.  Cc tri?u ch?ng c?a qu v? khng c?i thi?n sau khi ???c ?i?u tr?Floyde Parkins? vi? bi? ? nng th??ng xuyn trong h?n hai tu?n. NGAY L?P T?C ?I K[facility] N?U:  Qu v? b? ?au ? cnh tay, c?, hm, r?ng Cuda?c l?ng.  Qu v? th?y ?? m? hi, chng m?t Castrogiovanni?c chong vng.  Qu v? b? ?au ng?c Duman?c th? d?c.  Qu v? nn v ch?t nn ra gi?ng nh? mu Moya?c b c ph.  Phn c?a qu v? c mu Delellis?c mu ?en. Thng tin ny khng nh?m m?c ?ch thay th? cho l?i khuyn m chuyn gia ch?m [place] s?c kh?e ni v?i qu v?. Hy b?o ??m qu v? ph?i th?o lu?n b?t k? v?n ?? g m qu v? c v?i chuyn gia ch?m [place] s?c kh?e c?a qu v?. Document Released: 01/10/2016 Document Revised: 01/10/2016 Document Reviewed: 01/13/2015 Elsevier Interactive Patient Education  2017 [employer].

## 2016-11-13 NOTE — Assessment & Plan Note (Signed)
Check labs today.

## 2016-11-13 NOTE — Assessment & Plan Note (Signed)
Recheck labs with hgba1c

## 2016-11-13 NOTE — Assessment & Plan Note (Signed)
Stable Pt is off meds

## 2016-11-13 NOTE — Progress Notes (Signed)
Patient ID: Latoya Lawson, female    DOB: 10-13-1965  Age: 51 y.o. MRN: [id number]    Subjective:  Subjective  HPI Latoya Lawson presents for f/u fatigue and labs.  She is concerned about the b12.  She also c/o abd pain after eating -- worsening over last 2 weeks .    Review of Systems  Constitutional: Negative for appetite change, diaphoresis, fatigue and unexpected weight change.  Eyes: Negative for pain, redness and visual disturbance.  Respiratory: Negative for cough, chest tightness, shortness of breath and wheezing.   Cardiovascular: Negative for chest pain, palpitations and leg swelling.  Gastrointestinal: Positive for abdominal pain. Negative for nausea and rectal pain.  Endocrine: Negative for cold intolerance, heat intolerance, polydipsia, polyphagia and polyuria.  Genitourinary: Negative for difficulty urinating, dysuria and frequency.  Neurological: Negative for dizziness, light-headedness, numbness and headaches.    History Past Medical History:  Diagnosis Date  . Abnormal uterine bleeding    after depo  . Anxiety   . Hypertension   . Wears glasses     She has a past surgical history that includes Breast enhancement surgery (2088); Nasal sinus surgery; and Mass excision (Left, 02/10/2014).   Her family history includes Diabetes in her father; Hypertension in her brother, father, maternal grandfather, maternal grandmother, mother, paternal grandfather, paternal grandmother, and sister; Stroke in her father.She reports that she has never smoked. She has never used smokeless tobacco. She reports that she drinks alcohol. She reports that she does not use drugs.  Current Outpatient Prescriptions on File Prior to Visit  Medication Sig Dispense Refill  . losartan (COZAAR) 100 MG tablet Take 1 tablet (100 mg total) by mouth daily. 90 tablet 3  . Vitamin D, Ergocalciferol, (DRISDOL) 50000 units CAPS capsule Take 1 capsule (50,000 Units total) by mouth every 7 (seven) days. 8 capsule 0     No current facility-administered medications on file prior to visit.      Objective:  Objective  Physical Exam  Constitutional: She is oriented to person, place, and time. She appears well-developed and well-nourished.  HENT:  Head: Normocephalic and atraumatic.  Eyes: Conjunctivae and EOM are normal.  Neck: Normal range of motion. Neck supple. No JVD present. Carotid bruit is not present. No thyromegaly present.  Cardiovascular: Normal rate, regular rhythm and normal heart sounds.   No murmur heard. Pulmonary/Chest: Effort normal and breath sounds normal. No respiratory distress. She has no wheezes. She has no rales. She exhibits no tenderness.  Abdominal: Soft. She exhibits no distension and no mass. There is tenderness in the epigastric area. There is no rebound and no guarding.  Musculoskeletal: She exhibits no edema.  Neurological: She is alert and oriented to person, place, and time.  Psychiatric: She has a normal mood and affect. Her behavior is normal. Judgment and thought content normal.  Nursing note and vitals reviewed.  BP 120/66 (BP Location: Left Arm, Cuff Size: Normal)   Pulse 66   Temp 98.1 F (36.7 C) (Oral)   Resp 16   Ht 5\' 2"  (1.575 m)   Wt 103 lb 3.2 oz (46.8 kg)   LMP 11/03/2016   SpO2 99%   BMI 18.88 kg/m  Wt Readings from Last 3 Encounters:  11/13/16 103 lb 3.2 oz (46.8 kg)  10/16/16 101 lb 8 oz (46 kg)  01/20/16 101 lb (45.8 kg)     Lab Results  Component Value Date   WBC 4.5 11/13/2016   HGB 11.8 (L) 11/13/2016  HCT 36.2 11/13/2016   PLT 213.0 11/13/2016   GLUCOSE 101 (H) 11/13/2016   CHOL 173 11/13/2016   TRIG 93.0 11/13/2016   HDL 71.70 11/13/2016   LDLCALC 83 11/13/2016   ALT 10 11/13/2016   AST 14 11/13/2016   NA 137 11/13/2016   K 4.5 11/13/2016   CL 104 11/13/2016   CREATININE 0.68 11/13/2016   BUN 12 11/13/2016   CO2 30 11/13/2016   TSH 1.41 10/16/2016   HGBA1C 5.7 11/13/2016    Mr Brain Wo Contrast  Result Date:  01/10/2016 CLINICAL DATA:  Elevated blood pressure with headaches. EXAM: MRI HEAD WITHOUT CONTRAST TECHNIQUE: Multiplanar, multiecho pulse sequences of the brain and surrounding structures were obtained without intravenous contrast. COMPARISON:  Limited CT of the sinuses, 11/10/2013. FINDINGS: No evidence for acute infarction, hemorrhage, mass lesion, hydrocephalus, or extra-axial fluid. Normal cerebral volume. No white matter disease. Pituitary, pineal, and cerebellar tonsils unremarkable. No upper cervical lesions. Flow voids are maintained throughout the carotid, basilar, and vertebral arteries. There are no areas of chronic hemorrhage. Visualized calvarium, skull base, and upper cervical osseous structures unremarkable. Scalp and extracranial soft tissues, orbits, sinuses, and mastoids show no acute process. Hypoplastic RIGHT maxillary sinus. IMPRESSION: Negative exam.  No acute intracranial findings. Electronically Signed   By: Staci Righter M.D.   On: 01/10/2016 18:26     Assessment & Plan:  Plan  I have discontinued Ms. Staszak's hydrochlorothiazide, amLODipine, and diclofenac. I am also having her start on omeprazole. Additionally, I am having her maintain her losartan and Vitamin D (Ergocalciferol).  Meds ordered this encounter  Medications  . omeprazole (PRILOSEC) 20 MG capsule    Sig: Take 1 capsule (20 mg total) by mouth daily.    Dispense:  30 capsule    Refill:  3    Problem List Items Addressed This Visit      Unprioritized   Elevated vitamin B12 level    Recheck today       Relevant Orders   Vitamin B12 (Completed)   HTN (hypertension)    Stable Pt is off meds      Relevant Orders   Comprehensive metabolic panel (Completed)   Lipid panel (Completed)   Hyperglycemia    Recheck labs with hgba1c      Relevant Orders   Hemoglobin A1c (Completed)   Vitamin D deficiency    Check labs today       Other Visit Diagnoses    Dyspepsia    -  Primary   Relevant Medications    omeprazole (PRILOSEC) 20 MG capsule   Other Relevant Orders   CBC with Differential/Platelet (Completed)   H. pylori antibody, IgG (Completed)   POCT urinalysis dipstick (Completed)      Follow-up: Return in about 3 months (around 02/10/2017).  Ann Held, DO

## 2016-11-13 NOTE — Assessment & Plan Note (Signed)
Recheck today. 

## 2016-11-13 NOTE — Progress Notes (Signed)
Pre visit review using our clinic review tool, if applicable. No additional management support is needed unless otherwise documented below in the visit note. 

## 2016-11-29 ENCOUNTER — Ambulatory Visit: Payer: BLUE CROSS/BLUE SHIELD | Admitting: Obstetrics & Gynecology

## 2017-02-06 ENCOUNTER — Telehealth: Payer: Self-pay | Admitting: Family Medicine

## 2017-02-06 DIAGNOSIS — I10 Essential (primary) hypertension: Secondary | ICD-10-CM

## 2017-02-06 MED ORDER — LOSARTAN POTASSIUM 100 MG PO TABS: 100.0000 mg | ORAL_TABLET | Freq: Every day | ORAL | 0 refills | Status: AC

## 2017-02-06 NOTE — Telephone Encounter (Signed)
Pt's son called in to make PCP aware that pt has moved and is unable to come in for medication f/u for losartan.   He says that she will have to establish care with a different provider where she lives. He would like to know if provider is able to supply pt with a 30 day supply until she establish care else where?    Pharmacy: [facility], [address] AT [facility] [address]

## 2017-02-06 NOTE — Telephone Encounter (Signed)
BP medication sent in. Patient notified.

## 2017-03-16 IMAGING — MR MR HEAD W/O CM
9 of 10 series · 35 of 48 positions shown · non-contrast
Comparison: Limited CT of the sinuses, 11/10/2013.

CLINICAL DATA: Elevated blood pressure with headaches.

EXAM:
MRI HEAD WITHOUT CONTRAST
TECHNIQUE: Multiplanar, multiecho pulse sequences of the brain and surrounding
structures were obtained without intravenous contrast.

[Series 3: T1 · sagittal · 5.0mm · 0.47mm/px · 1 of 23 slices shown]
[im 1/23]
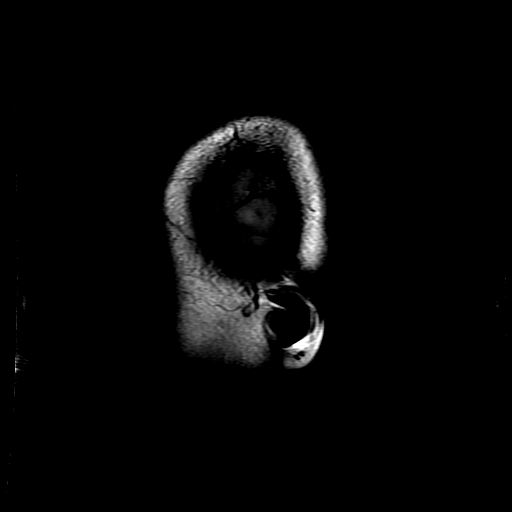

[Series 4: DWI · axial · 3.0mm · 1.09mm/px · z∈[-63,+61]mm · 8 of 90 slices shown (1 of 4)]
[im 1/90]
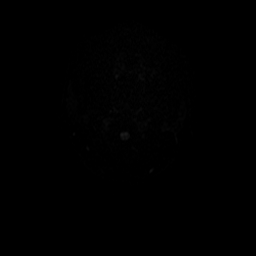
[im 10/90]
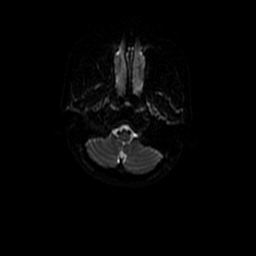
[im 30/90]
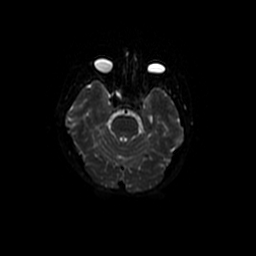
[im 40/90]
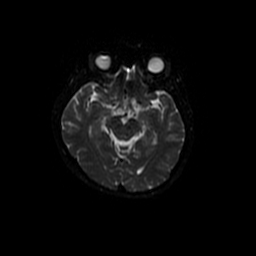
[im 50/90]
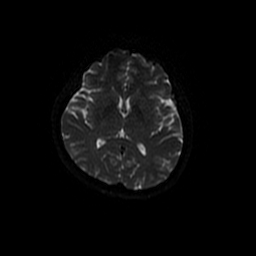
[im 60/90]
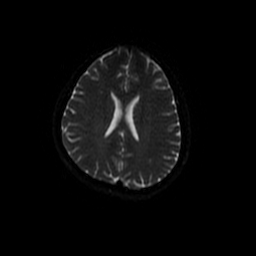
[im 80/90]
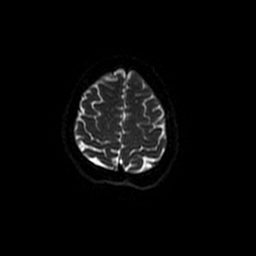
[im 90/90]
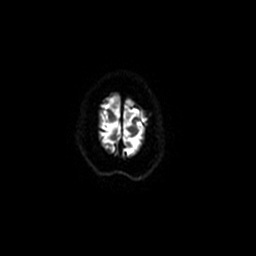

[Series 6: ax mpgr · axial · 5.0mm · 0.43mm/px · z∈[-56,+5]mm · 2 of 24 slices shown]
[im 1/24]
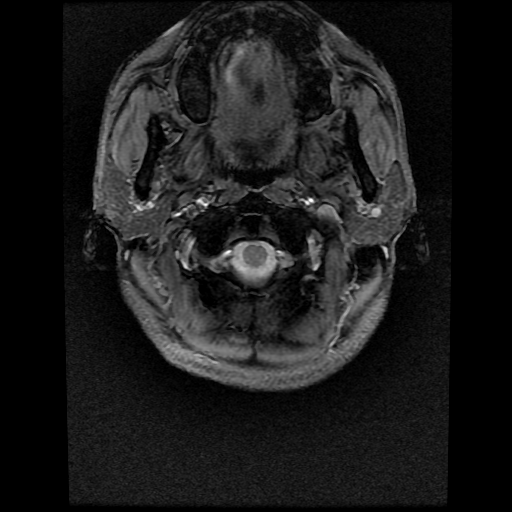
[im 12/24]
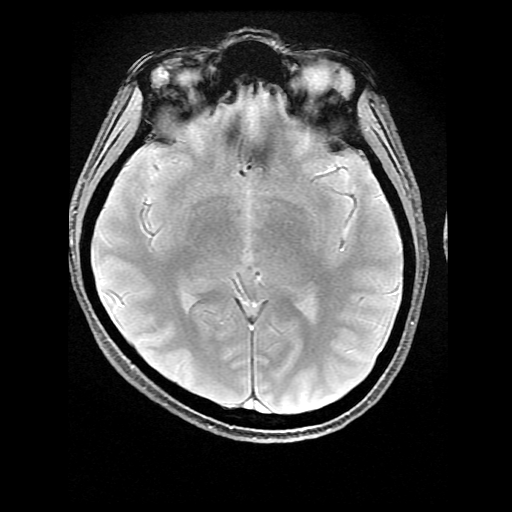

[Series 7: T2 · axial · 5.0mm · 0.43mm/px · z∈[-56,+72]mm · 3 of 24 slices shown (1 of 2)]
[im 1/24]
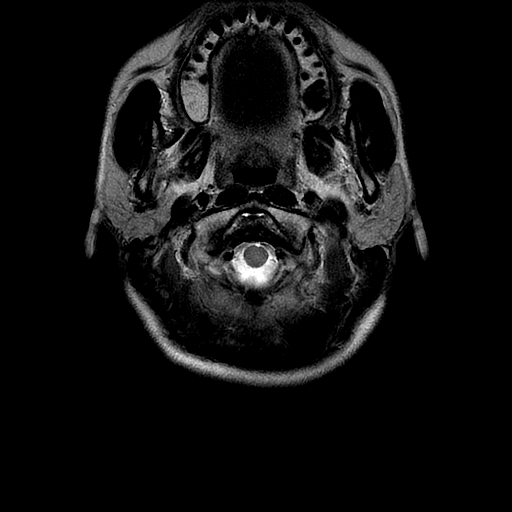
[im 12/24]
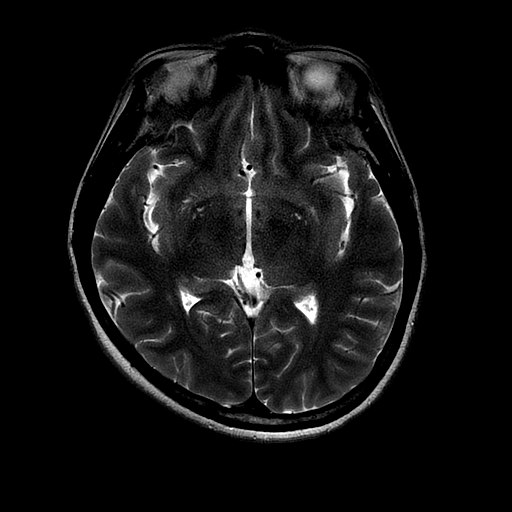
[im 24/24]
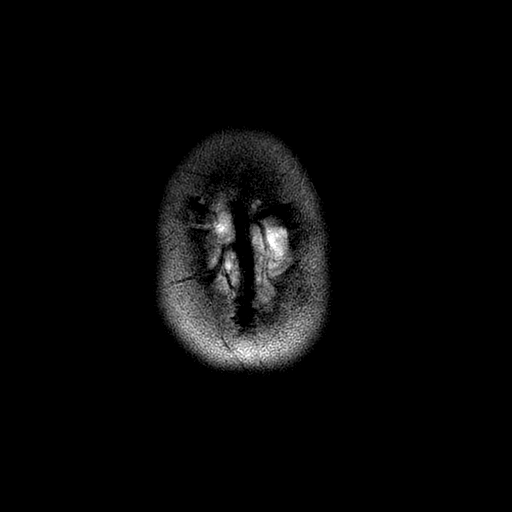

[Series 8: FLAIR · axial · 5.0mm · 0.43mm/px · z∈[-56,+72]mm · 3 of 24 slices shown]
[im 1/24]
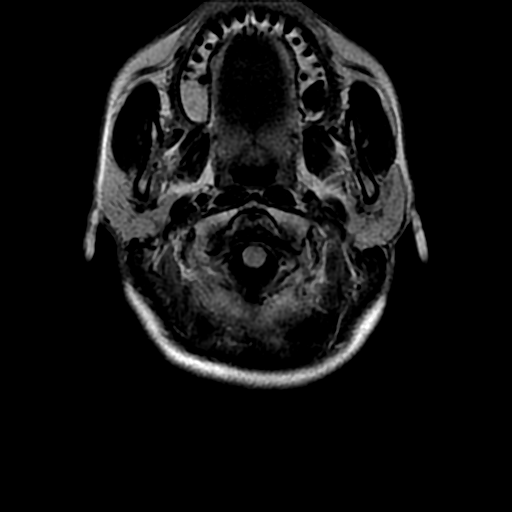
[im 12/24]
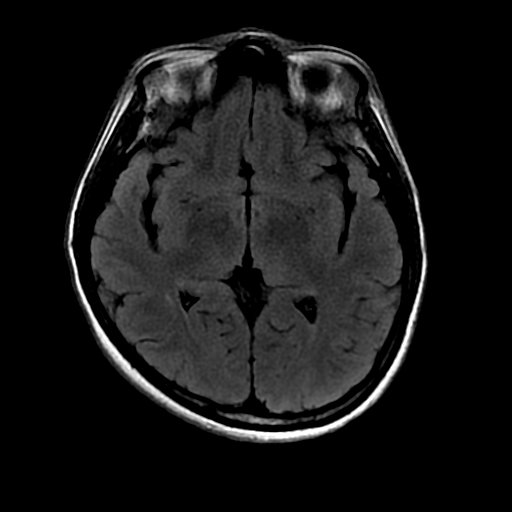
[im 24/24]
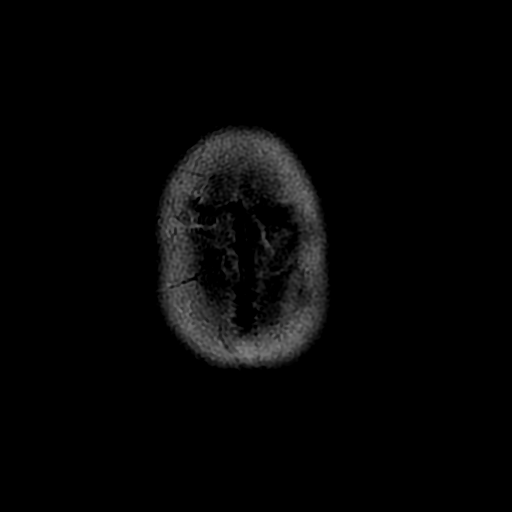

[Series 10: DWI · coronal · 5.0mm · 1.09mm/px · 7 of 62 slices shown (2 of 4)]
[im 1/62]
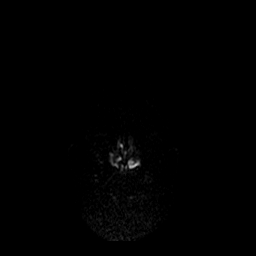
[im 11/62]
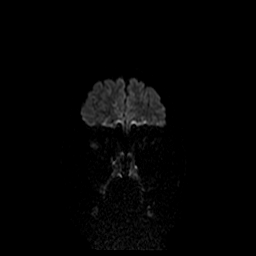
[im 21/62]
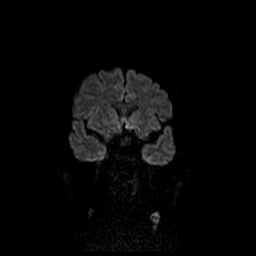
[im 31/62]
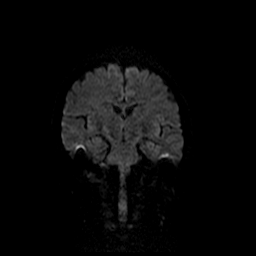
[im 41/62]
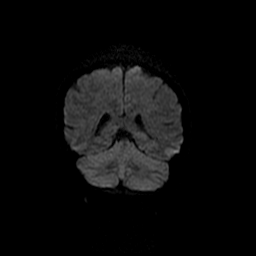
[im 51/62]
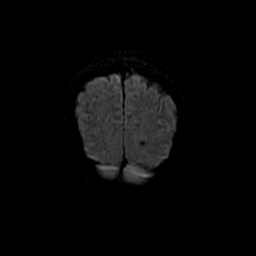
[im 62/62]
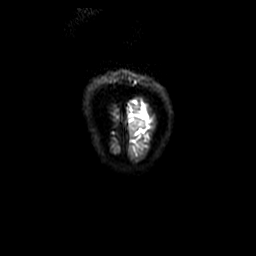

[Series 11: T2 · coronal · 5.0mm · 0.43mm/px · 3 of 27 slices shown (2 of 2)]
[im 1/27]
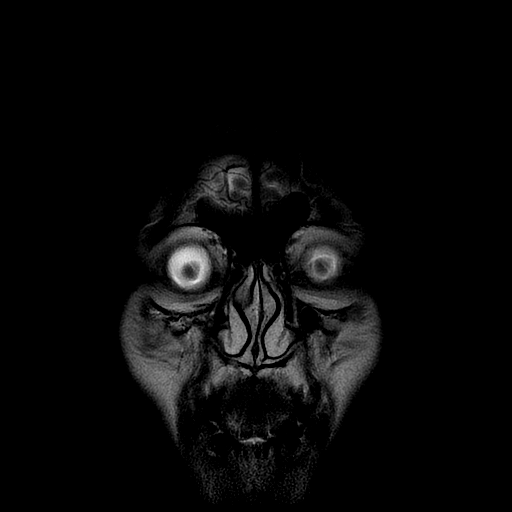
[im 14/27]
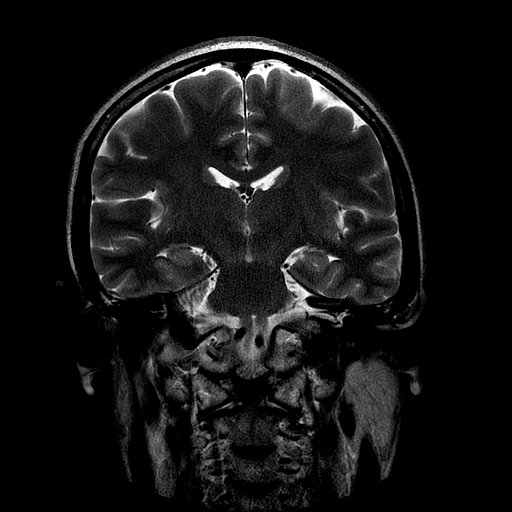
[im 27/27]
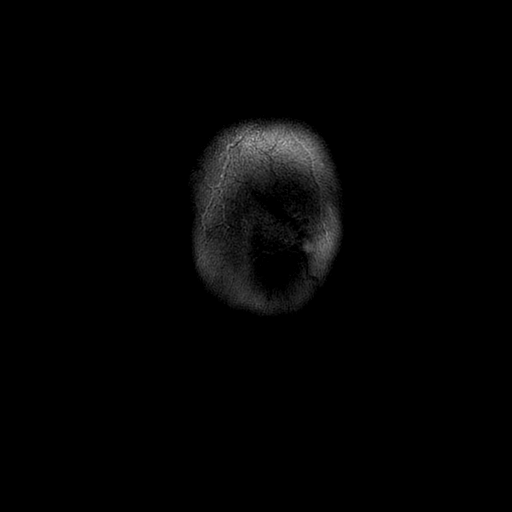

[Series 400: DWI · axial · 3.0mm · 1.09mm/px · z∈[-63,+61]mm · 5 of 45 slices shown (3 of 4)]
[im 1/45]
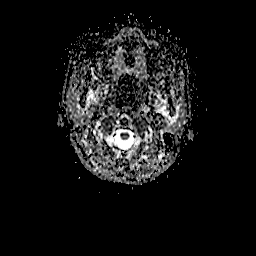
[im 12/45]
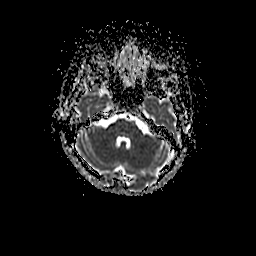
[im 23/45]
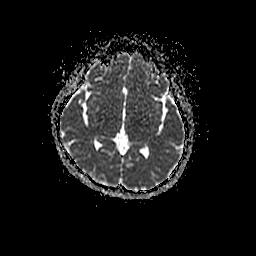
[im 34/45]
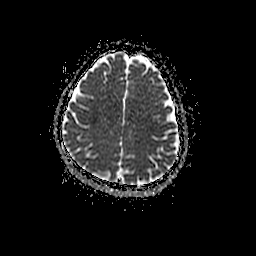
[im 45/45]
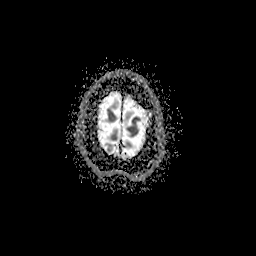

[Series 1000: DWI · coronal · 5.0mm · 1.09mm/px · 3 of 31 slices shown (4 of 4)]
[im 1/31]
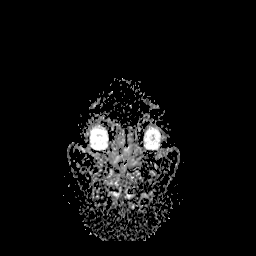
[im 16/31]
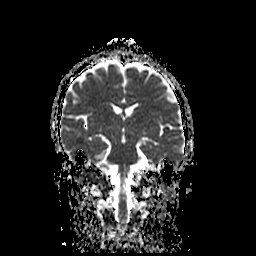
[im 31/31]
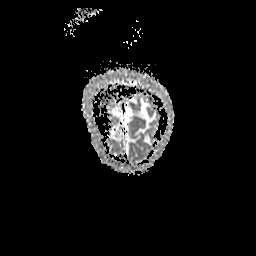

[35 of 48 positions shown; findings below may reference images not displayed]

FINDINGS: No evidence for acute infarction, hemorrhage, mass lesion,
hydrocephalus, or extra-axial fluid. Normal cerebral volume. No
white matter disease. Pituitary, pineal, and cerebellar tonsils
unremarkable. No upper cervical lesions. Flow voids are maintained
throughout the carotid, basilar, and vertebral arteries. There are
no areas of chronic hemorrhage.

Visualized calvarium, skull base, and upper cervical osseous
structures unremarkable. Scalp and extracranial soft tissues,
orbits, sinuses, and mastoids show no acute process. Hypoplastic
RIGHT maxillary sinus.
IMPRESSION: Negative exam.  No acute intracranial findings.
